# Patient Record
Sex: Female | Born: 1955 | Race: White | Hispanic: No | Marital: Married | Smoking: Never smoker
Health system: Southern US, Community
[De-identification: ages and names within clinical notes are randomized; demographics above are authoritative.]

## PROBLEM LIST (undated history)

## (undated) DIAGNOSIS — K219 Gastro-esophageal reflux disease without esophagitis: Secondary | ICD-10-CM

## (undated) DIAGNOSIS — R51 Headache: Secondary | ICD-10-CM

## (undated) DIAGNOSIS — E079 Disorder of thyroid, unspecified: Secondary | ICD-10-CM

## (undated) DIAGNOSIS — F419 Anxiety disorder, unspecified: Secondary | ICD-10-CM

## (undated) DIAGNOSIS — T7840XA Allergy, unspecified, initial encounter: Secondary | ICD-10-CM

## (undated) DIAGNOSIS — R519 Headache, unspecified: Secondary | ICD-10-CM

## (undated) DIAGNOSIS — E785 Hyperlipidemia, unspecified: Secondary | ICD-10-CM

## (undated) DIAGNOSIS — H269 Unspecified cataract: Secondary | ICD-10-CM

## (undated) DIAGNOSIS — I10 Essential (primary) hypertension: Secondary | ICD-10-CM

## (undated) DIAGNOSIS — F32A Depression, unspecified: Secondary | ICD-10-CM

## (undated) DIAGNOSIS — M1990 Unspecified osteoarthritis, unspecified site: Secondary | ICD-10-CM

## (undated) DIAGNOSIS — E669 Obesity, unspecified: Secondary | ICD-10-CM

## (undated) DIAGNOSIS — M797 Fibromyalgia: Secondary | ICD-10-CM

## (undated) DIAGNOSIS — E119 Type 2 diabetes mellitus without complications: Secondary | ICD-10-CM

## (undated) HISTORY — DX: Unspecified cataract: H26.9

## (undated) HISTORY — PX: EYE SURGERY: SHX253

## (undated) HISTORY — PX: OTHER SURGICAL HISTORY: SHX169

## (undated) HISTORY — DX: Headache: R51

## (undated) HISTORY — DX: Depression, unspecified: F32.A

## (undated) HISTORY — PX: CATARACT EXTRACTION: SUR2

## (undated) HISTORY — DX: Anxiety disorder, unspecified: F41.9

## (undated) HISTORY — DX: Headache, unspecified: R51.9

## (undated) HISTORY — DX: Hyperlipidemia, unspecified: E78.5

## (undated) HISTORY — DX: Fibromyalgia: M79.7

## (undated) HISTORY — DX: Essential (primary) hypertension: I10

## (undated) HISTORY — DX: Gastro-esophageal reflux disease without esophagitis: K21.9

## (undated) HISTORY — DX: Obesity, unspecified: E66.9

## (undated) HISTORY — DX: Allergy, unspecified, initial encounter: T78.40XA

## (undated) HISTORY — DX: Disorder of thyroid, unspecified: E07.9

## (undated) HISTORY — DX: Type 2 diabetes mellitus without complications: E11.9

## (undated) HISTORY — DX: Unspecified osteoarthritis, unspecified site: M19.90

---

## 1998-05-21 ENCOUNTER — Other Ambulatory Visit: Admission: RE | Admit: 1998-05-21 | Discharge: 1998-05-21 | Payer: Self-pay | Admitting: Obstetrics & Gynecology

## 1999-06-06 ENCOUNTER — Other Ambulatory Visit: Admission: RE | Admit: 1999-06-06 | Discharge: 1999-06-06 | Payer: Self-pay | Admitting: Obstetrics & Gynecology

## 2000-07-27 ENCOUNTER — Other Ambulatory Visit: Admission: RE | Admit: 2000-07-27 | Discharge: 2000-07-27 | Payer: Self-pay | Admitting: Obstetrics & Gynecology

## 2000-08-19 ENCOUNTER — Encounter: Admission: RE | Admit: 2000-08-19 | Discharge: 2000-11-17 | Payer: Self-pay

## 2001-08-09 ENCOUNTER — Other Ambulatory Visit: Admission: RE | Admit: 2001-08-09 | Discharge: 2001-08-09 | Payer: Self-pay | Admitting: Obstetrics & Gynecology

## 2002-09-21 ENCOUNTER — Other Ambulatory Visit: Admission: RE | Admit: 2002-09-21 | Discharge: 2002-09-21 | Payer: Self-pay | Admitting: Obstetrics & Gynecology

## 2003-10-16 ENCOUNTER — Other Ambulatory Visit: Admission: RE | Admit: 2003-10-16 | Discharge: 2003-10-16 | Payer: Self-pay | Admitting: Obstetrics & Gynecology

## 2004-12-02 ENCOUNTER — Other Ambulatory Visit: Admission: RE | Admit: 2004-12-02 | Discharge: 2004-12-02 | Payer: Self-pay | Admitting: Obstetrics & Gynecology

## 2008-10-11 ENCOUNTER — Emergency Department (HOSPITAL_COMMUNITY): Admission: EM | Admit: 2008-10-11 | Discharge: 2008-10-11 | Payer: Self-pay | Admitting: Emergency Medicine

## 2010-12-17 LAB — POCT CARDIAC MARKERS
CKMB, poc: <1 ng/mL — ABNORMAL LOW (ref 1.0–8.0)
Myoglobin, poc: 35.7 ng/mL (ref 12–200)
Troponin i, poc: <0.05 ng/mL (ref 0.00–0.09)

## 2011-11-10 ENCOUNTER — Other Ambulatory Visit: Payer: Self-pay | Admitting: Neurosurgery

## 2011-11-10 DIAGNOSIS — G35 Multiple sclerosis: Secondary | ICD-10-CM

## 2011-11-13 ENCOUNTER — Ambulatory Visit
Admission: RE | Admit: 2011-11-13 | Discharge: 2011-11-13 | Disposition: A | Discharge status: Home / Self Care | Payer: BC Managed Care – PPO | Source: Ambulatory Visit | Attending: Neurosurgery | Admitting: Neurosurgery

## 2011-11-13 DIAGNOSIS — G35 Multiple sclerosis: Secondary | ICD-10-CM

## 2012-03-18 ENCOUNTER — Other Ambulatory Visit: Payer: Self-pay | Admitting: Neurosurgery

## 2012-03-18 DIAGNOSIS — M542 Cervicalgia: Secondary | ICD-10-CM

## 2012-03-18 DIAGNOSIS — M5410 Radiculopathy, site unspecified: Secondary | ICD-10-CM

## 2012-03-25 ENCOUNTER — Other Ambulatory Visit: Payer: Self-pay | Admitting: Neurology

## 2012-03-25 DIAGNOSIS — R9089 Other abnormal findings on diagnostic imaging of central nervous system: Secondary | ICD-10-CM

## 2012-03-25 DIAGNOSIS — G35 Multiple sclerosis: Secondary | ICD-10-CM

## 2012-03-25 DIAGNOSIS — R200 Anesthesia of skin: Secondary | ICD-10-CM

## 2012-03-26 ENCOUNTER — Other Ambulatory Visit: Payer: BC Managed Care – PPO

## 2012-03-29 ENCOUNTER — Other Ambulatory Visit: Payer: Self-pay | Admitting: Neurology

## 2012-03-29 ENCOUNTER — Ambulatory Visit
Admission: RE | Admit: 2012-03-29 | Discharge: 2012-03-29 | Disposition: A | Discharge status: Home / Self Care | Payer: BC Managed Care – PPO | Source: Ambulatory Visit | Attending: Neurosurgery | Admitting: Neurosurgery

## 2012-03-29 ENCOUNTER — Ambulatory Visit
Admission: RE | Admit: 2012-03-29 | Discharge: 2012-03-29 | Disposition: A | Discharge status: Home / Self Care | Payer: BC Managed Care – PPO | Source: Ambulatory Visit | Attending: Neurology | Admitting: Neurology

## 2012-03-29 VITALS — BP 137/96 | HR 74

## 2012-03-29 VITALS — BP 141/76 | HR 80

## 2012-03-29 DIAGNOSIS — M542 Cervicalgia: Secondary | ICD-10-CM

## 2012-03-29 DIAGNOSIS — M5410 Radiculopathy, site unspecified: Secondary | ICD-10-CM

## 2012-03-29 DIAGNOSIS — G35 Multiple sclerosis: Secondary | ICD-10-CM

## 2012-03-29 DIAGNOSIS — R200 Anesthesia of skin: Secondary | ICD-10-CM

## 2012-03-29 DIAGNOSIS — R839 Unspecified abnormal finding in cerebrospinal fluid: Secondary | ICD-10-CM

## 2012-03-29 DIAGNOSIS — R9089 Other abnormal findings on diagnostic imaging of central nervous system: Secondary | ICD-10-CM

## 2012-03-29 DIAGNOSIS — G35D Multiple sclerosis, unspecified: Secondary | ICD-10-CM

## 2012-03-29 LAB — CSF CELL COUNT WITH DIFFERENTIAL

## 2012-03-29 MED ORDER — MEPERIDINE HCL 100 MG/ML IJ SOLN
75.0000 mg | Freq: Once | INTRAMUSCULAR | Status: AC
  Administered 2012-03-29: 75 mg via INTRAMUSCULAR

## 2012-03-29 MED ORDER — DIAZEPAM 5 MG PO TABS
10.0000 mg | ORAL_TABLET | Freq: Once | ORAL | Status: AC
  Administered 2012-03-29: 10 mg via ORAL

## 2012-03-29 MED ORDER — IOHEXOL 300 MG/ML  SOLN: 10.0000 mL | Freq: Once | INTRAMUSCULAR | Status: AC | PRN

## 2012-03-29 MED ORDER — ONDANSETRON HCL 4 MG/2ML IJ SOLN: 4.0000 mg | Freq: Four times a day (QID) | INTRAMUSCULAR | Status: DC | PRN

## 2012-03-29 MED ORDER — ONDANSETRON HCL 4 MG/2ML IJ SOLN
4.0000 mg | Freq: Once | INTRAMUSCULAR | Status: AC
  Administered 2012-03-29: 4 mg via INTRAMUSCULAR

## 2012-03-29 NOTE — Progress Notes (Signed)
Patient states she has been off Citalopram and Ultram for the past two days.  Discharge instructions explained to patient.  jkl

## 2012-03-30 LAB — GLUCOSE, CSF: Glucose, CSF: 54 mg/dL (ref 43–76)

## 2012-03-30 LAB — PROTEIN, CSF: Total Protein, CSF: 38 mg/dL (ref 15–45)

## 2012-03-31 LAB — VDRL, CSF

## 2012-04-01 LAB — CNS IGG SYNTHESIS RATE, CSF+BLOOD
Albumin, Serum(Neph): 4.4 g/dL (ref 3.5–4.9)
IgG Index, CSF: 0.45 (ref <0.66)

## 2012-04-01 LAB — ANGIOTENSIN CONVERTING ENZYME, CSF: ACE, CSF: 6 U/L (ref <=15)

## 2012-04-02 LAB — B. BURGDORFI ANTIBODIES, CSF

## 2012-11-08 ENCOUNTER — Other Ambulatory Visit: Payer: Self-pay | Admitting: Neurology

## 2012-11-08 DIAGNOSIS — R413 Other amnesia: Secondary | ICD-10-CM

## 2012-11-08 DIAGNOSIS — M797 Fibromyalgia: Secondary | ICD-10-CM

## 2012-11-08 DIAGNOSIS — H532 Diplopia: Secondary | ICD-10-CM

## 2012-11-11 ENCOUNTER — Ambulatory Visit
Admission: RE | Admit: 2012-11-11 | Discharge: 2012-11-11 | Disposition: A | Discharge status: Home / Self Care | Payer: BC Managed Care – PPO | Source: Ambulatory Visit | Attending: Neurology | Admitting: Neurology

## 2012-11-11 DIAGNOSIS — M797 Fibromyalgia: Secondary | ICD-10-CM

## 2012-11-11 DIAGNOSIS — R413 Other amnesia: Secondary | ICD-10-CM

## 2012-11-11 DIAGNOSIS — H532 Diplopia: Secondary | ICD-10-CM

## 2012-11-11 MED ORDER — GADOBENATE DIMEGLUMINE 529 MG/ML IV SOLN
16.0000 mL | Freq: Once | INTRAVENOUS | Status: AC | PRN
  Administered 2012-11-11: 16 mL via INTRAVENOUS

## 2013-03-23 ENCOUNTER — Ambulatory Visit: Payer: BC Managed Care – PPO | Admitting: Dietician

## 2013-03-24 ENCOUNTER — Encounter: Payer: BC Managed Care – PPO | Attending: Physician Assistant | Admitting: Dietician

## 2013-03-24 ENCOUNTER — Encounter: Payer: Self-pay | Admitting: Dietician

## 2013-03-24 VITALS — Ht 64.0 in | Wt 227.2 lb

## 2013-03-24 DIAGNOSIS — E669 Obesity, unspecified: Secondary | ICD-10-CM | POA: Insufficient documentation

## 2013-03-24 DIAGNOSIS — Z713 Dietary counseling and surveillance: Secondary | ICD-10-CM | POA: Insufficient documentation

## 2013-03-24 NOTE — Patient Instructions (Addendum)
Make sure that you eat three meals and 2 snacks most days. Eat meals at the table and not in front of the TV.  Eat in proportion to MyPlate - buy ready to eat salads and vegetables.  Have some snacks available at work. Consider planning out meals for the week using meal guidelines from [employer]it.  Schedule physical activity seek out classes or people to join you.

## 2013-03-24 NOTE — Progress Notes (Signed)
   Medical Nutrition Therapy:  Appt start time: 1130 end time:  1230.   Assessment:  Primary concerns today: Interested in losing weight and "getting more energy". 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 Denise Liu states she was 95 pounds when got married at 57 years old and has gained weight over time with a sit-down job.  Denise Liu reports that she currently lacking motivation to eat healthier meals and get more physical activity. Has previously lost 12 pounds on Weight Watchers but lost motivation to continue after reaching a weight loss plateau and has since gained that weight back and more. Also previously went to a gym and had a  Systems analyst but struggles with consistency. Would love to get down to 140 lbs.  Currently eats out about two times per week, regularly eats cakes and other sweets and is trying to cut out bread. Denise Liu says that she enjoys walking when she is motivated to do it. Discussed recruiting co-workers, friends, or husband to go on regular walks with her, planning meals and snacks for the week, and eating more mindfully at the table instead of in front of the TV. Discussed making splurges such as cake really worth it by considering the rest of the meal with it.    MEDICATIONS: See List   DIETARY INTAKE:   24-hr recall:  B ( AM): oatmeal with fruit OR scrambled egg with little bit of cheese and toast with water or sometimes skim milk  Snk ( AM): none  L ( PM): sandwich such as [place] and cheese, torillas OR  leftovers such as chicken or casserole OR salad OR Pizza with water Snk ( PM): candy bar or cookie D ( PM): spaghetti OR chicken and broccoli usually vegetable and meat Snk ( PM): cake or cookies Beverages: usually water  Usual physical activity: Goes for walks around once per week for 45 minutes.    Estimated energy needs: 1600 calories 180 g carbohydrates 120  g protein 44 g fat  Progress Towards Goal(s):  In progress.   Nutritional Diagnosis:  NB-1.1 Food and nutrition-related knowledge deficit As related to meal skipping, energy dense snack choices, and lack of physical activity.  As evidenced by BMI of 39.1.    Intervention:  Nutrition counseling included:  Make sure that you eat three meals and 2 snacks most days. Eat meals at the table and not in front of the TV.  Eat in proportion to MyPlate - buy ready to eat salads and vegetables.  Have some snacks available at work. Consider planning out meals for the week using meal guidelines from [employer].  Schedule physical activity seek out classes or people to join you.   Handouts given during visit include:  MyPlate  1200 and 1500 calorie meal plans from eatright.org  Healthy Snack handout  Monitoring/Evaluation:  Dietary intake, exercise, mindful eating, and body weight in 4 week(s).

## 2013-04-21 ENCOUNTER — Encounter: Payer: BC Managed Care – PPO | Attending: Physician Assistant | Admitting: Dietician

## 2013-04-21 DIAGNOSIS — E669 Obesity, unspecified: Secondary | ICD-10-CM | POA: Insufficient documentation

## 2013-04-21 DIAGNOSIS — Z713 Dietary counseling and surveillance: Secondary | ICD-10-CM | POA: Insufficient documentation

## 2013-04-21 NOTE — Progress Notes (Signed)
   Medical Nutrition Therapy:  Appt start time: 1200 end time:  1230.  Assessment: Lost pound since last visit. Reports still being very busy with school and eating less bread, more vegetables, and smaller portion sizes. In past week has been eating more sweets (apple fritters) since husband has been out of town. Trying to eat less meals in front the TV.   Walking about one time per week. Taking the stairs instead of the elevator but is struggling finding the time and energy to exercise.   Wt Readings from Last 3 Encounters:  04/21/13 226 lb 11.2 oz (102.83 kg)  03/24/13 227 lb 3.2 oz (103.057 kg)   Ht Readings from Last 3 Encounters:  04/21/13 5\' 4"  (1.626 m)  03/24/13 5\' 4"  (1.626 m)   Body mass index is 38.89 kg/(m^2). @BMIFA @ Normalized weight-for-age data available only for age 92 to 20 years. Normalized stature-for-age data available only for age 92 to 20 years.   MEDICATIONS: See List   DIETARY INTAKE:   24-hr recall:  B ( AM): oatmeal with fruit OR scrambled egg with little bit of cheese and toast with water or sometimes skim milk  Snk ( AM): none  L ( PM): sandwich such as [place] and cheese, torillas OR  leftovers such as chicken or casserole OR salad OR Pizza with water Snk ( PM): candy bar or cookie D ( PM): spaghetti OR chicken and broccoli usually vegetable and meat Snk ( PM): cake or cookies Beverages: usually water  Usual physical activity: Goes for walks around once per week for 45 minutes.    Estimated energy needs: 1600 calories 180 g carbohydrates 120 g protein 44 g fat  Progress Towards Goal(s):  In progress.   Nutritional Diagnosis:  NB-1.1 Food and nutrition-related knowledge deficit As related to meal skipping, energy dense snack choices, and lack of physical activity.  As evidenced by BMI of 39.1.    Intervention:  Nutrition counseling provided. Discussed setting up home environment so it is easier to make healthy choices by not buying sweets  anymore. Talked about having snacks with protein and serving smaller portion sizes at meal times. Encouraged her to keep adding vegetables to meals even to take out meals she brings home. Encouraged her to find time to exercise and eat meals without TV.   Plan: Make sure that you eat three meals and 2-3 snacks most days. Eat meals at the table and not in front of the TV.  Continue filling half of your plate with vegetables. Continue drinking water throughout the day or no-calorie drinks.  Consider not buying sweets at home.  Have some snacks available at work such peanut butter crackers, cheese and crackers, fruit and nuts.  Try to add more physical activity into your week with a goal of 30 min/day 5 x week.   Handouts given during visit include:  15g CHO snacks  Monitoring/Evaluation:  Dietary intake, exercise, mindful eating, and body weight in 4 week(s).

## 2013-04-21 NOTE — Patient Instructions (Addendum)
Make sure that you eat three meals and 2-3 snacks most days. Eat meals at the table and not in front of the TV.  Continue filling half of your plate with vegetables. Continue drinking water throughout the day or no-calorie drinks.  Consider not buying sweets at home.  Have some snacks available at work such peanut butter crackers, cheese and crackers, fruit and nuts.  Try to add more physical activity into your week with a goal of 30 min/day 5 x week.

## 2013-05-19 ENCOUNTER — Encounter: Payer: BC Managed Care – PPO | Attending: Physician Assistant | Admitting: Dietician

## 2013-05-19 VITALS — Ht 64.0 in | Wt 224.5 lb

## 2013-05-19 DIAGNOSIS — Z713 Dietary counseling and surveillance: Secondary | ICD-10-CM | POA: Insufficient documentation

## 2013-05-19 DIAGNOSIS — E669 Obesity, unspecified: Secondary | ICD-10-CM | POA: Insufficient documentation

## 2013-05-19 NOTE — Patient Instructions (Addendum)
Continue making sure that you eat three meals and 2-3 snacks most days. Keep working on eat meals at the table and not in front of the TV.  Continue filling half of your plate with vegetables. Continue drinking water throughout the day or no-calorie drinks.  Consider having only one cookie a day.  Continue having some snacks available at work such peanut butter crackers, cheese and crackers, fruit and nuts.  Try to add more physical activity into your week with a goal of 30 min/day 5 x week. Consider walking at lunchtime with co-workers.

## 2013-05-19 NOTE — Progress Notes (Signed)
   Medical Nutrition Therapy:  Appt start time: 1200 end time:  1230.  Assessment: Denise Liu is here for a follow up for weight loss. Lost 2 pounds since the last visit. Work is really busy (even more than before). Has not had any time to exercise. Eating less sweets than before and less meals in front of the TV.   Wt Readings from Last 3 Encounters:  05/19/13 224 lb 8 oz (101.833 kg)  04/21/13 226 lb 11.2 oz (102.83 kg)  03/24/13 227 lb 3.2 oz (103.057 kg)   Ht Readings from Last 3 Encounters:  05/19/13 5\' 4"  (1.626 m)  04/21/13 5\' 4"  (1.626 m)  03/24/13 5\' 4"  (1.626 m)   Body mass index is 38.52 kg/(m^2). @BMIFA @ Normalized weight-for-age data available only for age 50 to 20 years. Normalized stature-for-age data available only for age 50 to 20 years.   MEDICATIONS: See List   DIETARY INTAKE:   24-hr recall:  B ( AM): oatmeal with fruit OR scrambled egg with little bit of cheese and toast with water or sometimes skim milk  Snk ( AM): mixed almonds with dried fruit  L ( PM): sandwich such as [place] and cheese, tortillas OR  leftovers such as chicken or casserole OR salad OR Pizza with water Snk ( PM):none D ( PM): spaghetti OR chicken and broccoli usually vegetable and meat Snk ( PM): cake or cookies Beverages: usually water  Usual physical activity: Has not had time to exercise.    Estimated energy needs: 1600 calories 180 g carbohydrates 120 g protein 44 g fat  Progress Towards Goal(s):  In progress.   Nutritional Diagnosis:  NB-1.1 Food and nutrition-related knowledge deficit As related to meal skipping, energy dense snack choices, and lack of physical activity.  As evidenced by BMI of 39.1.    Intervention:  Nutrition counseling provided. Recommended focusing on incorporating exercise into routine, continuing to eat small portions with vegetables, and limiting sweets.   Plan: Make sure that you eat three meals and 2-3 snacks most days. Eat meals at the table and  not in front of the TV.  Continue filling half of your plate with vegetables. Continue drinking water throughout the day or no-calorie drinks.  Consider not buying sweets at home.  Have some snacks available at work such peanut butter crackers, cheese and crackers, fruit and nuts.  Try to add more physical activity into your week with a goal of 30 min/day 5 x week.    Monitoring/Evaluation:  Dietary intake, exercise, mindful eating, and body weight in 2 month(s).

## 2013-07-07 ENCOUNTER — Other Ambulatory Visit: Payer: Self-pay

## 2014-06-16 ENCOUNTER — Other Ambulatory Visit: Payer: Self-pay

## 2014-06-29 ENCOUNTER — Other Ambulatory Visit: Payer: Self-pay | Admitting: Otolaryngology

## 2014-06-29 DIAGNOSIS — R070 Pain in throat: Secondary | ICD-10-CM

## 2014-07-10 ENCOUNTER — Ambulatory Visit
Admission: RE | Admit: 2014-07-10 | Discharge: 2014-07-10 | Disposition: A | Discharge status: Home / Self Care | Payer: BC Managed Care – PPO | Source: Ambulatory Visit | Attending: Otolaryngology | Admitting: Otolaryngology

## 2014-07-10 DIAGNOSIS — R070 Pain in throat: Secondary | ICD-10-CM

## 2014-07-10 MED ORDER — IOHEXOL 300 MG/ML  SOLN
75.0000 mL | Freq: Once | INTRAMUSCULAR | Status: AC | PRN
  Administered 2014-07-10: 75 mL via INTRAVENOUS

## 2016-05-12 IMAGING — CT CT NECK W/ CM
4 of 6 series · 14 of 33 positions shown, 16 images · IV contrast (75CC OMNI 300)
Comparison: None.

CLINICAL DATA: Left throat and ear discomfort 6 weeks.

EXAM:
CT NECK WITH CONTRAST
TECHNIQUE: Multidetector CT imaging of the neck was performed using the
standard protocol following the bolus administration of intravenous
contrast.
CONTRAST:  75mL OMNIPAQUE IOHEXOL 300 MG/ML  SOLN

[Series 3: axial neck · axial · 0.42mm/px · z∈[+79,+152]mm · 2 of 87 slices shown]
[im 29/87  bone]
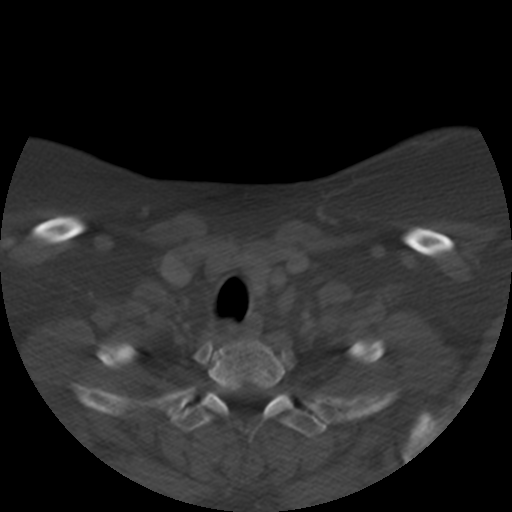
[im 58/87  bone]
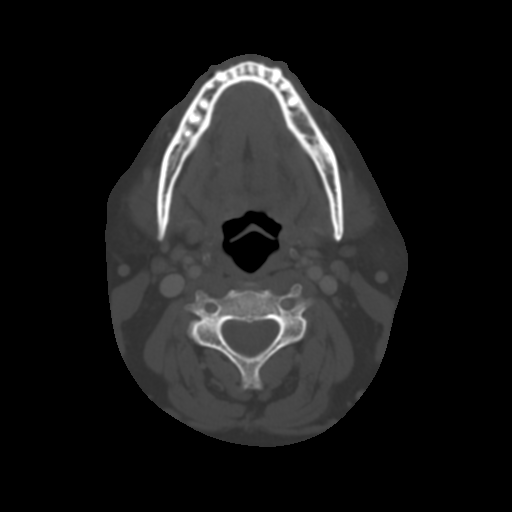

[Series 200: coronal · coronal · 0.42mm/px · 3 of 105 slices shown]
[im 29/105  bone]
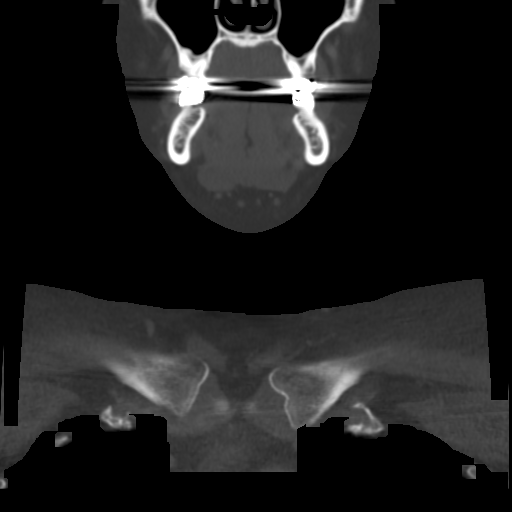
[im 45/105  bone]
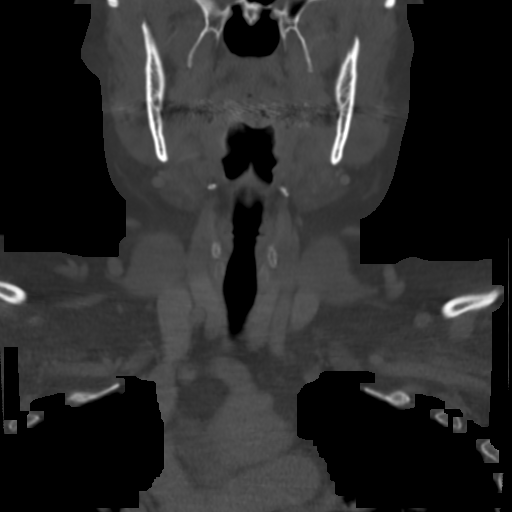
[im 61/105  bone]
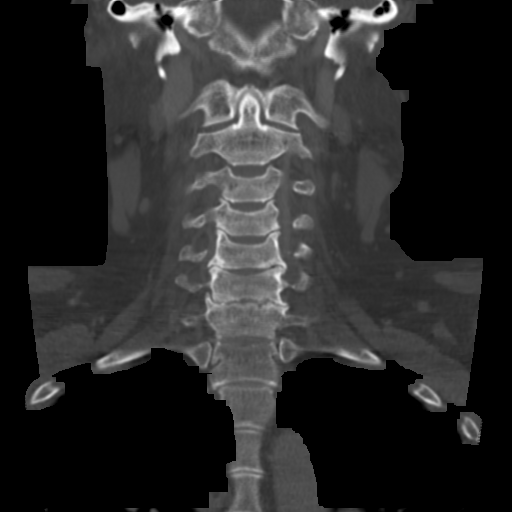

[Series 201: sagittal · sagittal · 0.42mm/px · 5 of 105 slices shown, 6 images]
[im 35/105  bone]
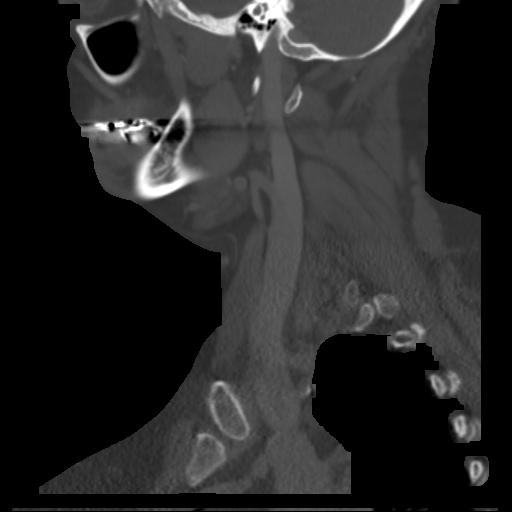
[im 44/105  bone]
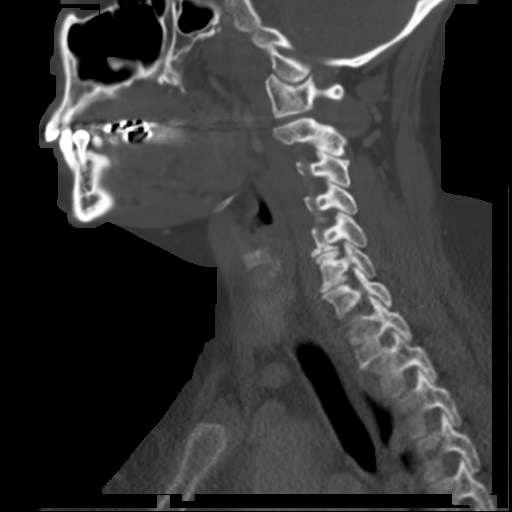
[im 53/105  soft-tissue]
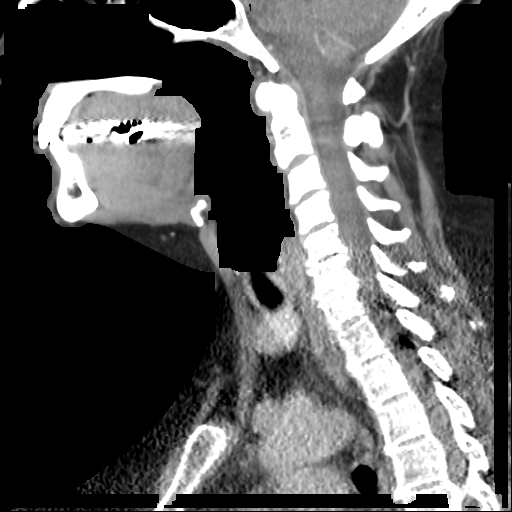
[im 53/105  bone]
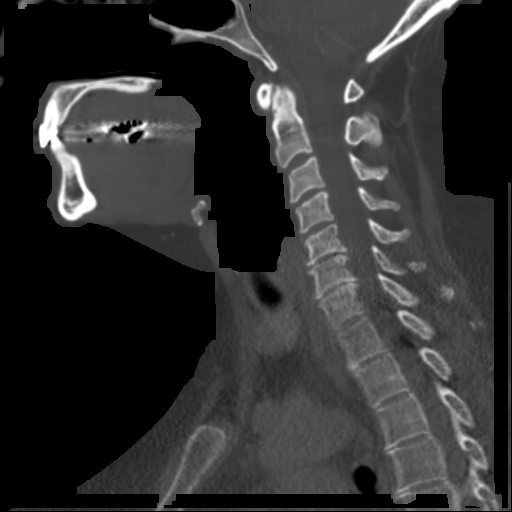
[im 61/105  bone]
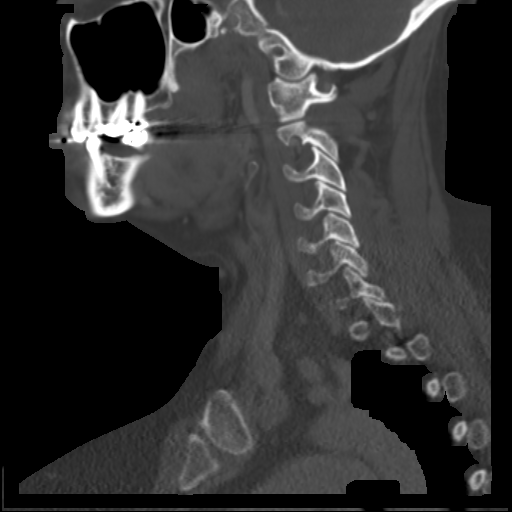
[im 70/105  bone]
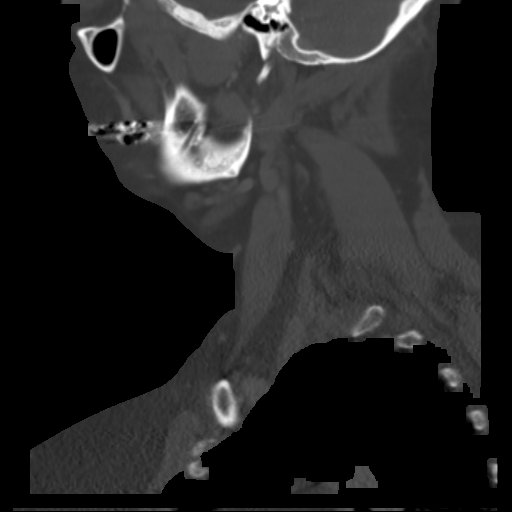

[Series 202: angled to hyoid · axial · 0.42mm/px · z∈[+4,+149]mm · 4 of 132 slices shown, 5 images]
[im 27/132  soft-tissue]
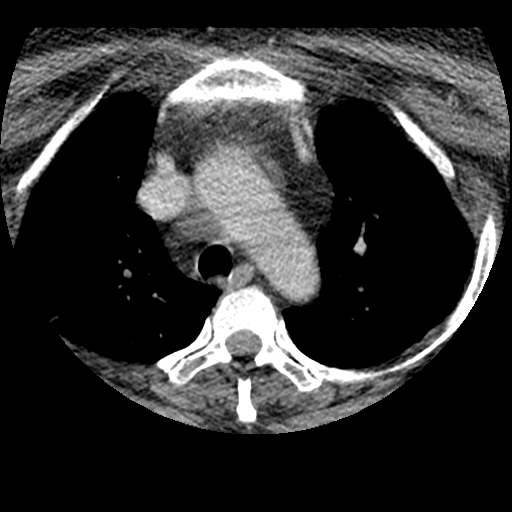
[im 27/132  bone]
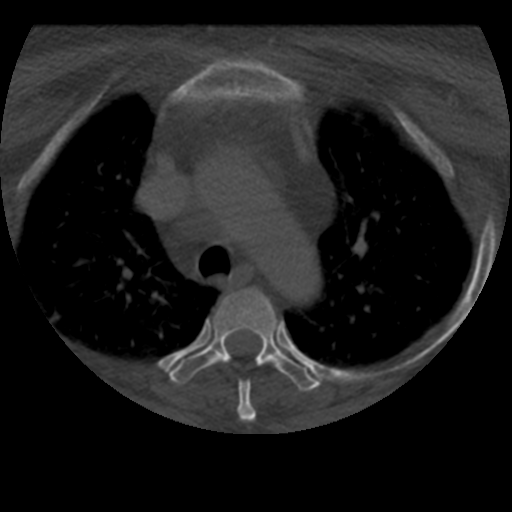
[im 53/132  bone]
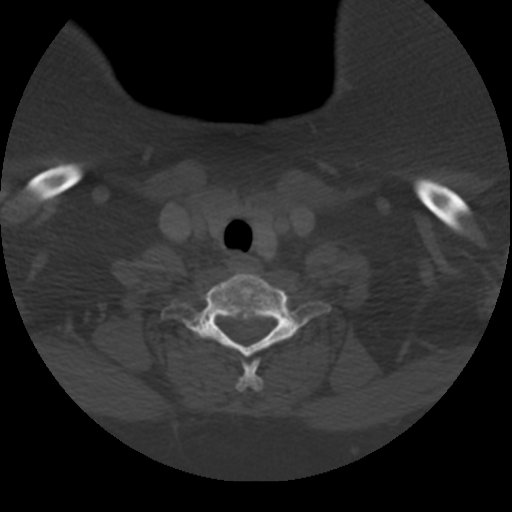
[im 79/132  bone]
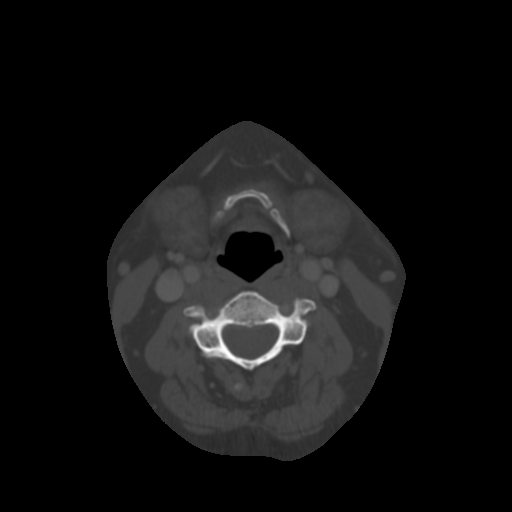
[im 105/132  bone]
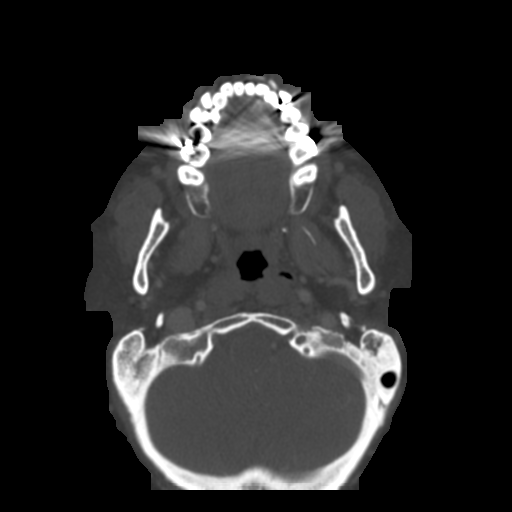

[14 of 33 positions shown; findings below may reference images not displayed]

FINDINGS: Lung apices are clear. Cervical spondylosis with disc degeneration
and diffuse uncinate spurring throughout the cervical spine, most
prominent at C5-6 and C6-7. No focal bony lesion identified.

Visualized paranasal sinuses and mastoid sinuses are clear
bilaterally.

The tongue and tonsils are normal. The pharynx appears normal
without edema or mass. Epiglottis and larynx are normal.

Parotid and submandibular glands are normal.  Thyroid is normal.

No pathologic adenopathy. Left level 2 lymph node 6.7 mm. Right
level 2 lymph node 7.6 mm.

Negative for mass lesion.
IMPRESSION: Negative for mass or adenopathy. No cause for throat pain is
identified. Direct visualization of the mucosa may be helpful.

## 2017-10-13 DIAGNOSIS — F4321 Adjustment disorder with depressed mood: Secondary | ICD-10-CM | POA: Insufficient documentation

## 2018-10-11 ENCOUNTER — Encounter: Payer: Self-pay | Admitting: Mental Health

## 2018-10-11 ENCOUNTER — Ambulatory Visit (INDEPENDENT_AMBULATORY_CARE_PROVIDER_SITE_OTHER): Admitting: Mental Health

## 2018-10-11 DIAGNOSIS — F331 Major depressive disorder, recurrent, moderate: Secondary | ICD-10-CM | POA: Diagnosis not present

## 2018-10-11 DIAGNOSIS — F411 Generalized anxiety disorder: Secondary | ICD-10-CM | POA: Diagnosis not present

## 2018-10-11 NOTE — Progress Notes (Signed)
[facility] Counsel[facility] Initial Adult Exam- Part I  Name: Denise Liu Date: 10/11/2018 MRN: [id number] DOB: November 10, 1955 PCP: Windy Carina, PA-C  Time spent: 45 minutes   Guardian/Payee:patent   Paperwork requested:  No   Reason for Visit /Presenting Problem: No chief complaint on file.   Mental Status Exam:   Appearance:   obese     Behavior:  Sharing  Motor:  FMG  Speech/Language:   Normal Rate  Affect:  Depressed  Mood:  depressed  Thought process:  Poor memory. Poor concentration  Thought content:    Rumination  Sensory/Perceptual disturbances:    WNL  Orientation:  oriented to person, place and time/date  Attention:  Fair  Concentration:  Poor  Memory:  fiber fog  Fund of knowledge:   Good  Insight:    Good  Judgment:   Good  Impulse Control:  Good   Reported Symptoms:  Sleep disturbance, Fatigue, Physical aches and pain, Lack of motivation and depressed  Risk Assessment: Danger to Self:  No Self-injurious Behavior: No Danger to Others: No Duty to Warn:no Physical Aggression / Violence:No  Access to Firearms a concern: No  Gang Involvement:No  Patient / guardian was educated about steps to take if suicide or homicide risk level increases between visits: no While future psychiatric events cannot be accurately predicted, the patient does not currently require acute inpatient psychiatric care and does not currently meet [facility] involuntary commitment criteria.  Substance Abuse History: Current substance abuse: No     Past Psychiatric History:   Previous psychological history is significant for depression Outpatient Providers:Devashwar History of Psych Hospitalization: No  Psychological Testing: none    Medical History/Surgical History:reviewed Past Medical History:  Diagnosis Date  . Hyperlipidemia   . Hypertension   . Obesity     Past Surgical History:  Procedure Laterality Date  . EYE SURGERY      Medications: Current Outpatient  Medications  Medication Sig Dispense Refill  . atorvastatin (LIPITOR) 10 MG tablet Take 10 mg by mouth daily.    Marland Kitchen b complex vitamins capsule Take 1 capsule by mouth daily.    . calcium carbonate 200 MG capsule Take 600 mg by mouth daily.    . Cholecalciferol (VITAMIN D3) 2000 UNITS TABS Take 4,000 Int'l Units by mouth.    . co-enzyme Q-10 50 MG capsule Take 300 mg by mouth daily.    . cyclobenzaprine (FLEXERIL) 10 MG tablet Take 10 mg by mouth 3 (three) times daily as needed for muscle spasms.    . Glucosamine-Chondroit-Vit C-Mn (GLUCOSAMINE 1500 COMPLEX PO) Take by mouth.    . lidocaine (LIDODERM) 5 % Place 1 patch onto the skin daily. Remove & Discard patch within 12 hours or as directed by MD    . losartan (COZAAR) 25 MG tablet Take 25 mg by mouth daily.    . Magnesium 250 MG TABS Take by mouth.    Marland Kitchen MANGANESE PO Take by mouth.    . Misc Natural Products (TART CHERRY ADVANCED PO) Take by mouth.    . modafinil (PROVIGIL) 100 MG tablet Take 200 mg by mouth daily.    . Multiple Vitamins-Minerals (MULTIVITAMIN & MINERAL PO) Take by mouth.    . naproxen sodium (ANAPROX) 220 MG tablet Take 220 mg by mouth as needed.    . Omega-3 Fatty Acids (FISH OIL) 1200 MG CAPS Take 2,400 mg by mouth.    . sertraline (ZOLOFT) 100 MG tablet Take 100 mg by mouth daily.    Marland Kitchen  traMADol (ULTRAM) 50 MG tablet Take 50 mg by mouth every 6 (six) hours as needed for pain.     No current facility-administered medications for this visit.     Allergies  Allergen Reactions  . Codeine   . Topamax [Topiramate] Other (See Comments)    Significant somnolence    Diagnoses:    ICD-10-CM   1. Generalized anxiety disorder F41.1   2. Major depressive disorder, recurrent episode, moderate (HCC) F33.1      Part II to be continued at next session:   No.   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

## 2018-10-25 ENCOUNTER — Ambulatory Visit (INDEPENDENT_AMBULATORY_CARE_PROVIDER_SITE_OTHER): Admitting: Mental Health

## 2018-10-25 ENCOUNTER — Encounter: Payer: Self-pay | Admitting: Mental Health

## 2018-10-25 DIAGNOSIS — F331 Major depressive disorder, recurrent, moderate: Secondary | ICD-10-CM | POA: Diagnosis not present

## 2018-10-25 DIAGNOSIS — F411 Generalized anxiety disorder: Secondary | ICD-10-CM

## 2018-10-25 NOTE — Progress Notes (Signed)
      [facility] Counselor/Therapist Progress Note  Patient ID: Denise Liu, MRN: [id number],    Date: 10/25/2018  Time Spent: 45 minutes  Treatment Type: Individual Therapy  Reported Symptoms:   Mental Status Exam:  Appearance:   Casual     Behavior:  Appropriate and Sharing  Motor:  fibromyalgia  Speech/Language:   Normal Rate  Affect:  Depressed  Mood:  anxious and depressed  Thought process:  unmotivated  Thought content:    negative  Sensory/Perceptual disturbances:    WNL  Orientation:  oriented to person, place and time/date  Attention:  Fair  Concentration:  Fair  Memory:  fair  Fund of knowledge:   Good  Insight:    Good  Judgment:   Good  Impulse Control:  Good   Risk Assessment: Danger to Self:  No Self-injurious Behavior: No Danger to Others: No Duty to Warn:no Physical Aggression / Violence:No  Access to Firearms a concern: No  Gang Involvement:No   Subjective: Depressed and anxious. Adhedonia. Trying not to isolate. Able to do some exercise occasionally. Motivation and goal setting poor. Sleep ade quate though reports stays up too late and sleeps too late in the morning. Exhausted.   Interventions: Cognitive Behavioral Therapy, Solution-Oriented/Positive Psychology, Insight-Oriented and Interpersonal  Diagnosis:   ICD-10-CM   1. Generalized anxiety disorder F41.1   2. Major depressive disorder, recurrent episode, moderate (HCC) F33.1     Plan:  FMG and migraine management            Increase motivation            Decrease symptoms of depression and anxiety and adhedonia            Validation and support  Ulice Bold, [facility]

## 2018-11-04 ENCOUNTER — Encounter: Payer: Self-pay | Admitting: Physician Assistant

## 2018-11-04 ENCOUNTER — Ambulatory Visit (INDEPENDENT_AMBULATORY_CARE_PROVIDER_SITE_OTHER): Admitting: Physician Assistant

## 2018-11-04 VITALS — BP 163/94 | HR 101 | Ht 64.0 in | Wt 230.0 lb

## 2018-11-04 DIAGNOSIS — F331 Major depressive disorder, recurrent, moderate: Secondary | ICD-10-CM

## 2018-11-04 MED ORDER — BUPROPION HCL ER (XL) 150 MG PO TB24: 150.0000 mg | ORAL_TABLET | Freq: Every day | ORAL | 1 refills | Status: DC

## 2018-11-04 NOTE — Patient Instructions (Signed)
Multi-Vitamin B Complex Vitamin D Gingko Biloba
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
## 2018-11-08 ENCOUNTER — Ambulatory Visit (INDEPENDENT_AMBULATORY_CARE_PROVIDER_SITE_OTHER): Admitting: Mental Health

## 2018-11-08 ENCOUNTER — Encounter: Payer: Self-pay | Admitting: Mental Health

## 2018-11-08 DIAGNOSIS — Z79899 Other long term (current) drug therapy: Secondary | ICD-10-CM | POA: Diagnosis not present

## 2018-11-08 DIAGNOSIS — F411 Generalized anxiety disorder: Secondary | ICD-10-CM

## 2018-11-08 NOTE — Progress Notes (Signed)
      [facility] Counselor/Therapist Progress Note  Patient ID: Denise Liu, MRN: [id number],    Date: 11/08/2018  Time Spent: 45  minutes  Treatment Type: Individual Therapy  Reported Symptoms:  Chronic pain from FMG, lethargy, not feeling well - sick with cold today. Not motivated.  Mental Status Exam:  Appearance:   Casual and sick with cold     Behavior:  Sharing and lethargic  Motor:  slowed down, unwell  Speech/Language:   Normal Rate  Affect:  Constricted and Depressed  Mood:  depressed  Thought process:  blocked and negative  Thought content:    negative  Sensory/Perceptual disturbances:    WNL  Orientation:  oriented to person, place and time/date  Attention:  Good  Concentration:  Fair  Memory:  fair  Fund of knowledge:   Good  Insight:    Good  Judgment:   Good  Impulse Control:  Good   Risk Assessment: Danger to Self:  No Self-injurious Behavior: No Danger to Others: No Duty to Warn:no Physical Aggression / Violence:No  Access to Firearms a concern: No  Gang Involvement:No   Subjective:  Sick with bad cold. Lethargic, depressed, unmotivated   Interventions: Cognitive Behavioral Therapy, Solution-Oriented/Positive Psychology, Insight-Oriented and Interpersonal  Diagnosis:   ICD-10-CM   1. Encounter for long-term (current) use of medications Z79.899   2. Generalized anxiety disorder F41.1     Plan: FMG issues, migraines management           Increase motivattion, decrease depression           Resolve family conflicts           Decrease symptoms of anxiety           Validation and support  Ulice Bold, [facility]

## 2018-11-22 ENCOUNTER — Ambulatory Visit: Admitting: Mental Health

## 2018-12-13 ENCOUNTER — Encounter: Payer: Self-pay | Admitting: Physician Assistant

## 2018-12-13 ENCOUNTER — Ambulatory Visit (INDEPENDENT_AMBULATORY_CARE_PROVIDER_SITE_OTHER): Admitting: Physician Assistant

## 2018-12-13 ENCOUNTER — Other Ambulatory Visit: Payer: Self-pay

## 2018-12-13 DIAGNOSIS — F411 Generalized anxiety disorder: Secondary | ICD-10-CM | POA: Diagnosis not present

## 2018-12-13 DIAGNOSIS — Z79899 Other long term (current) drug therapy: Secondary | ICD-10-CM | POA: Diagnosis not present

## 2018-12-13 DIAGNOSIS — F331 Major depressive disorder, recurrent, moderate: Secondary | ICD-10-CM

## 2018-12-13 MED ORDER — BUPROPION HCL ER (XL) 150 MG PO TB24: 150.0000 mg | ORAL_TABLET | Freq: Every day | ORAL | 0 refills | Status: DC

## 2018-12-13 NOTE — Progress Notes (Signed)
[facility] Med Check  Patient ID: Denise Liu,  MRN: [id number]  PCP: Windy Liu, Denise J, PA-C  Date of Evaluation: 12/13/2018 Time spent:15 minutes  Chief Complaint:  Chief Complaint    Follow-up     Virtual Visit via Telephone Note  I connected with Denise Liu on 12/13/18 at  2:30 PM EDT by telephone and verified that I am speaking with the correct person using two identifiers.   I discussed the limitations, risks, security and privacy concerns of performing an evaluation and management service by telephone and the availability of in person appointments. I also discussed with the patient that there may be a patient responsible charge related to this service. The patient expressed understanding and agreed to proceed.    HISTORY/CURRENT STATUS: HPI for 6-week follow-up.  Denise Liu was seen on 11/04/2018 for an initial visit.  She had been having more depression and we added Wellbutrin to current regimen.  Patient states that now she is feeling quite a bit better.  She has more energy and motivation.  She wants to do things more than she did before going on the Wellbutrin.  She is not as tired all the time.  She is not crying easily.  She sleeps well feels rested when she gets up.  He wonders if really needs to be on the Zoloft as well as the Wellbutrin.  The Zoloft had been increased from 100 mg to 200 mg approximately early February of this year.  States she is not sure that the increase did much at the time.  The Wellbutrin seems to be what made the most difference.  Denies any anxiety.  Of course with the coronavirus pandemic, she has concerns but otherwise she is doing okay.  Denies muscle or joint pain, stiffness, or dystonia.  Denies dizziness, syncope, seizures, numbness, tingling, tremor, tics, unsteady gait, slurred speech, confusion.   Individual Medical History/ Review of Systems: Changes? :No    Past medications for mental health diagnoses include: Topamax for  migraines   Allergies: Codeine and Topamax [topiramate]  Current Medications:  Current Outpatient Medications:  .  atorvastatin (LIPITOR) 10 MG tablet, Take 10 mg by mouth daily., Disp: , Rfl:  .  b complex vitamins capsule, Take 1 capsule by mouth daily., Disp: , Rfl:  .  buPROPion (WELLBUTRIN XL) 150 MG 24 hr tablet, Take 1 tablet (150 mg total) by mouth daily., Disp: 90 tablet, Rfl: 0 .  Cholecalciferol (VITAMIN D3) 2000 UNITS TABS, Take 4,000 Int'l Units by mouth., Disp: , Rfl:  .  cyclobenzaprine (FLEXERIL) 10 MG tablet, Take 10 mg by mouth 3 (three) times daily as needed for muscle spasms., Disp: , Rfl:  .  fluticasone (FLONASE) 50 MCG/ACT nasal spray, 1 spray by Each Nare route daily., Disp: , Rfl:  .  gabapentin (NEURONTIN) 300 MG capsule, , Disp: , Rfl:  .  losartan (COZAAR) 25 MG tablet, Take 25 mg by mouth daily., Disp: , Rfl:  .  naproxen sodium (ANAPROX) 220 MG tablet, Take 220 mg by mouth as needed., Disp: , Rfl:  .  omeprazole (PRILOSEC) 20 MG capsule, TAKE 1 CAPSULE DAILY, Disp: , Rfl:  .  sertraline (ZOLOFT) 100 MG tablet, Take 200 mg by mouth daily. , Disp: , Rfl:  .  calcium carbonate 200 MG capsule, Take 600 mg by mouth daily., Disp: , Rfl:  .  co-enzyme Q-10 50 MG capsule, Take 300 mg by mouth daily., Disp: , Rfl:  .  Glucosamine-Chondroit-Vit C-Mn (GLUCOSAMINE 1500 COMPLEX PO),  Take by mouth., Disp: , Rfl:  .  lidocaine (LIDODERM) 5 %, Place 1 patch onto the skin daily. Remove & Discard patch within 12 hours or as directed by MD, Disp: , Rfl:  .  Magnesium 250 MG TABS, Take by mouth., Disp: , Rfl:  .  MANGANESE PO, Take by mouth., Disp: , Rfl:  .  Misc Natural Products (TART CHERRY ADVANCED PO), Take by mouth., Disp: , Rfl:  .  modafinil (PROVIGIL) 100 MG tablet, Take 200 mg by mouth daily., Disp: , Rfl:  .  Multiple Vitamins-Minerals (MULTIVITAMIN & MINERAL PO), Take by mouth., Disp: , Rfl:  .  Omega-3 Fatty Acids (FISH OIL) 1200 MG CAPS, Take 2,400 mg by mouth.,  Disp: , Rfl:  .  traMADol (ULTRAM) 50 MG tablet, Take 50 mg by mouth every 6 (six) hours as needed for pain., Disp: , Rfl:  Medication Side Effects: none  Family Medical/ Social History: Changes? No  MENTAL HEALTH EXAM:  There were no vitals taken for this visit.There is no height or weight on file to calculate BMI.  General Appearance: phone visit, unable to assess  Eye Contact:  unable to assess  Speech:  Clear and Coherent  Volume:  Normal  Mood:  Euthymic  Affect:  Appropriate  Thought Process:  Goal Directed  Orientation:  Full (Time, Place, and Person)  Thought Content: Logical   Suicidal Thoughts:  No  Homicidal Thoughts:  No  Memory:  WNL  Judgement:  Good  Insight:  Good  Psychomotor Activity:  unable to assess  Concentration:  Concentration: Good  Recall:  Good  Fund of Knowledge: Good  Language: Good  Assets:  Desire for Improvement  ADL's:  Intact  Cognition: WNL  Prognosis:  Good    DIAGNOSES:    ICD-10-CM   1. Major depressive disorder, recurrent episode, moderate (HCC) F33.1   2. Generalized anxiety disorder F41.1   3. Encounter for long-term (current) use of medications Z79.899     Receiving Psychotherapy: Yes  with Denise Liu, LPC   RECOMMENDATIONS: Glad to see her doing so well! Discussed options as far as the Zoloft.  I think it would be okay to decrease Zoloft to 150mg /d, and see how she feels over the next month to 6 weeks. Continue Wellbutrin XL 150 mg every morning. Continue B complex, vitamin D, multivitamin daily, omega-3, and co-Q10. Continue gabapentin 300 mg nightly. Continue modafinil 200 mg daily. Therapy with Denise Liu, LPC. 6 weeks or sooner as needed.   Denise Overly, PA-C   This record has been created using [employer].  Chart creation errors have been sought, but may not always have been located and corrected. Such creation errors do not reflect on the standard of medical care.

## 2019-01-25 ENCOUNTER — Ambulatory Visit (INDEPENDENT_AMBULATORY_CARE_PROVIDER_SITE_OTHER): Admitting: Physician Assistant

## 2019-01-25 ENCOUNTER — Encounter: Payer: Self-pay | Admitting: Physician Assistant

## 2019-01-25 ENCOUNTER — Other Ambulatory Visit: Payer: Self-pay

## 2019-01-25 DIAGNOSIS — F331 Major depressive disorder, recurrent, moderate: Secondary | ICD-10-CM

## 2019-01-25 DIAGNOSIS — F411 Generalized anxiety disorder: Secondary | ICD-10-CM | POA: Diagnosis not present

## 2019-01-25 MED ORDER — BUPROPION HCL ER (XL) 300 MG PO TB24: 300.0000 mg | ORAL_TABLET | Freq: Every day | ORAL | 0 refills | Status: DC

## 2019-01-25 NOTE — Progress Notes (Signed)
[facility] Med Check  Patient ID: Denise Liu,  MRN: [id number]  PCP: Windy CarinaGray, Erin J, PA-C  Date of Evaluation: 01/25/2019 Time spent:15 minutes  Chief Complaint:  Chief Complaint    Depression     Virtual Visit via Telephone Note  I connected with patient by a video enabled telemedicine application or telephone, with their informed consent, and verified patient privacy and that I am speaking with the correct person using two identifiers.  I am private, in my home and the patient is home.  I discussed the limitations, risks, security and privacy concerns of performing an evaluation and management service by telephone and the availability of in person appointments. I also discussed with the patient that there may be a patient responsible charge related to this service. The patient expressed understanding and agreed to proceed.   I discussed the assessment and treatment plan with the patient. The patient was provided an opportunity to ask questions and all were answered. The patient agreed with the plan and demonstrated an understanding of the instructions.   The patient was advised to call back or seek an in-person evaluation if the symptoms worsen or if the condition fails to improve as anticipated.  I provided 15 minutes of non-face-to-face time during this encounter.  HISTORY/CURRENT STATUS: HPI For routine med check.  At LOV 6 weeks ago, we decreased the Zoloft.  She was on Zoloft 100 mg for many years and would prefer to go back down to that dose if at all possible.  Feels that the addition of Wellbutrin has helped some.  She still has decreased motivation and energy on most days.  The past few weeks have been hard due to the fibromyalgia.  She has not really felt like doing much.  She is not isolating any more than she has to due to the coronavirus pandemic.  She denies suicidal or homicidal thoughts.  She sleeps well most of the time.  Denies anxiety except from what seems  to be normal concerns for the coronavirus pandemic.  Denies dizziness, syncope, seizures, numbness, tingling, tremor, tics, unsteady gait, slurred speech, confusion.  She does report a burning sensation in her feet.  She was prescribed gabapentin for that but it makes her too sleepy.  She will discuss with her PCP next week.  She has chronic muscle pain from fibromyalgia.  Individual Medical History/ Review of Systems: Changes? :No    Past medications for mental health diagnoses include: Topamax for migraines  Allergies: Codeine and Topamax [topiramate]  Current Medications:  Current Outpatient Medications:  .  atorvastatin (LIPITOR) 10 MG tablet, Take 10 mg by mouth daily., Disp: , Rfl:  .  b complex vitamins capsule, Take 1 capsule by mouth daily., Disp: , Rfl:  .  calcium carbonate 200 MG capsule, Take 600 mg by mouth daily., Disp: , Rfl:  .  Cholecalciferol (VITAMIN D3) 2000 UNITS TABS, Take 4,000 Int'l Units by mouth., Disp: , Rfl:  .  co-enzyme Q-10 50 MG capsule, Take 300 mg by mouth daily., Disp: , Rfl:  .  cyclobenzaprine (FLEXERIL) 10 MG tablet, Take 10 mg by mouth 3 (three) times daily as needed for muscle spasms., Disp: , Rfl:  .  fluticasone (FLONASE) 50 MCG/ACT nasal spray, 1 spray by Each Nare route daily., Disp: , Rfl:  .  gabapentin (NEURONTIN) 300 MG capsule, at bedtime as needed. , Disp: , Rfl:  .  losartan (COZAAR) 25 MG tablet, Take 25 mg by mouth daily., Disp: , Rfl:  .  Misc Natural Products (TART CHERRY ADVANCED PO), Take by mouth., Disp: , Rfl:  .  Multiple Vitamins-Minerals (MULTIVITAMIN & MINERAL PO), Take by mouth., Disp: , Rfl:  .  naproxen sodium (ANAPROX) 220 MG tablet, Take 220 mg by mouth as needed., Disp: , Rfl:  .  omeprazole (PRILOSEC) 20 MG capsule, TAKE 1 CAPSULE DAILY, Disp: , Rfl:  .  sertraline (ZOLOFT) 100 MG tablet, Take 150 mg by mouth daily. , Disp: , Rfl:  .  buPROPion (WELLBUTRIN XL) 300 MG 24 hr tablet, Take 1 tablet (300 mg total) by mouth  daily., Disp: 90 tablet, Rfl: 0 .  Glucosamine-Chondroit-Vit C-Mn (GLUCOSAMINE 1500 COMPLEX PO), Take by mouth., Disp: , Rfl:  .  lidocaine (LIDODERM) 5 %, Place 1 patch onto the skin daily. Remove & Discard patch within 12 hours or as directed by MD, Disp: , Rfl:  .  Magnesium 250 MG TABS, Take by mouth., Disp: , Rfl:  .  MANGANESE PO, Take by mouth., Disp: , Rfl:  .  modafinil (PROVIGIL) 100 MG tablet, Take 200 mg by mouth daily., Disp: , Rfl:  .  Omega-3 Fatty Acids (FISH OIL) 1200 MG CAPS, Take 2,400 mg by mouth., Disp: , Rfl:  .  traMADol (ULTRAM) 50 MG tablet, Take 50 mg by mouth every 6 (six) hours as needed for pain., Disp: , Rfl:  Medication Side Effects: none  Family Medical/ Social History: Changes? No  MENTAL HEALTH EXAM:  There were no vitals taken for this visit.There is no height or weight on file to calculate BMI.  General Appearance: unable to assess  Eye Contact:  unable to assess  Speech:  Clear and Coherent  Volume:  Normal  Mood:  Euthymic  Affect:  unable to assess  Thought Process:  Goal Directed  Orientation:  Full (Time, Place, and Person)  Thought Content: Logical   Suicidal Thoughts:  No  Homicidal Thoughts:  No  Memory:  WNL  Judgement:  Good  Insight:  Good  Psychomotor Activity:  unable to assess  Concentration:  Concentration: Good  Recall:  Good  Fund of Knowledge: Good  Language: Good  Assets:  Desire for Improvement  ADL's:  Intact  Cognition: WNL  Prognosis:  Good    DIAGNOSES:    ICD-10-CM   1. Major depressive disorder, recurrent episode, moderate (HCC) F33.1   2. Generalized anxiety disorder F41.1     Receiving Psychotherapy: No    RECOMMENDATIONS: Increase Wellbutrin XL to 300 mg p.o. every morning. Decrease Zoloft to 100 mg p.o. daily.  She really wants to go back to as low as possible.  Her PCP prescribes the Zoloft. Continue modafinil 200 mg p.o. daily Continue multivitamin daily, B complex, fish oil, co-Q10,  calcium. Return in 6 weeks.  Melony Overly, PA-C   This record has been created using [employer].  Chart creation errors have been sought, but may not always have been located and corrected. Such creation errors do not reflect on the standard of medical care.

## 2019-02-20 ENCOUNTER — Emergency Department (HOSPITAL_BASED_OUTPATIENT_CLINIC_OR_DEPARTMENT_OTHER)
Admission: EM | Admit: 2019-02-20 | Discharge: 2019-02-21 | Disposition: A | Discharge status: Home / Self Care | Attending: Emergency Medicine | Admitting: Emergency Medicine

## 2019-02-20 ENCOUNTER — Encounter (HOSPITAL_BASED_OUTPATIENT_CLINIC_OR_DEPARTMENT_OTHER): Payer: Self-pay | Admitting: Emergency Medicine

## 2019-02-20 ENCOUNTER — Other Ambulatory Visit: Payer: Self-pay

## 2019-02-20 DIAGNOSIS — L299 Pruritus, unspecified: Secondary | ICD-10-CM | POA: Diagnosis present

## 2019-02-20 DIAGNOSIS — T7840XA Allergy, unspecified, initial encounter: Secondary | ICD-10-CM | POA: Diagnosis not present

## 2019-02-20 DIAGNOSIS — I10 Essential (primary) hypertension: Secondary | ICD-10-CM | POA: Diagnosis not present

## 2019-02-20 DIAGNOSIS — Z79899 Other long term (current) drug therapy: Secondary | ICD-10-CM | POA: Insufficient documentation

## 2019-02-20 MED ORDER — FAMOTIDINE 20 MG PO TABS
20.0000 mg | ORAL_TABLET | Freq: Once | ORAL | Status: AC
  Administered 2019-02-20: 20 mg via ORAL
  Filled 2019-02-20: qty 1

## 2019-02-20 MED ORDER — DEXAMETHASONE SODIUM PHOSPHATE 10 MG/ML IJ SOLN
10.0000 mg | Freq: Once | INTRAMUSCULAR | Status: AC
  Administered 2019-02-20: 10 mg via INTRAMUSCULAR
  Filled 2019-02-20: qty 1

## 2019-02-20 MED ORDER — FAMOTIDINE 20 MG PO TABS: 20.0000 mg | ORAL_TABLET | Freq: Two times a day (BID) | ORAL | 0 refills | Status: DC

## 2019-02-20 MED ORDER — PREDNISONE 20 MG PO TABS: ORAL_TABLET | ORAL | 0 refills | Status: DC
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
## 2019-02-20 NOTE — ED Provider Notes (Signed)
[facility] Provider Note   CSN: [id number] Arrival date & time: 02/20/19  2319     History   Chief Complaint Chief Complaint  Patient presents with  . Pruritis  . Rash    HPI Denise Liu is a 63 y.o. female.     The history is provided by the patient.  Rash Location:  Hand and shoulder/arm Shoulder/arm rash location:  R forearm, L forearm, R wrist, L wrist, L hand and R hand Hand rash location:  R hand and L hand Quality: itchiness   Quality: not blistering, not bruising, not burning, not painful and not peeling   Severity:  Moderate Onset quality:  Gradual Timing:  Constant Progression:  Unchanged Chronicity:  New Context: insect bite/sting   Context comment:  Mosquito bite to the left thigh Relieved by:  Nothing Worsened by:  Nothing Ineffective treatments:  Antihistamines Associated symptoms: no fever, no shortness of breath, no sore throat, no throat swelling, no tongue swelling and not wheezing   Sitting in chair at home and a mosquito bit her now itching and hives.  Took 2 benadryl PTA and starting to feel better.    Past Medical History:  Diagnosis Date  . Anxiety   . Fibromyalgia   . Headache   . Hyperlipidemia   . Hypertension   . Obesity     There are no active problems to display for this patient.   Past Surgical History:  Procedure Laterality Date  . EYE SURGERY     repaired 'cross-eyes'     OB History   No obstetric history on file.      Home Medications    Prior to Admission medications   Medication Sig Start Date End Date Taking? Authorizing Provider  atorvastatin (LIPITOR) 10 MG tablet Take 10 mg by mouth daily.    [provider]  b complex vitamins capsule Take 1 capsule by mouth daily.    [provider]  buPROPion (WELLBUTRIN XL) 300 MG 24 hr tablet Take 1 tablet (300 mg total) by mouth daily. 01/25/19   Donnal Moat T, PA-C  calcium carbonate 200 MG capsule Take 600 mg  by mouth daily.    [provider]  Cholecalciferol (VITAMIN D3) 2000 UNITS TABS Take 4,000 Int'l Units by mouth.    [provider]  co-enzyme Q-10 50 MG capsule Take 300 mg by mouth daily.    [provider]  cyclobenzaprine (FLEXERIL) 10 MG tablet Take 10 mg by mouth 3 (three) times daily as needed for muscle spasms.    [provider]  fluticasone (FLONASE) 50 MCG/ACT nasal spray 1 spray by Each Nare route daily. 04/10/17   [provider]  gabapentin (NEURONTIN) 300 MG capsule at bedtime as needed.  09/21/18   [provider]  Glucosamine-Chondroit-Vit C-Mn (GLUCOSAMINE 1500 COMPLEX PO) Take by mouth.    [provider]  lidocaine (LIDODERM) 5 % Place 1 patch onto the skin daily. Remove & Discard patch within 12 hours or as directed by MD    [provider]  losartan (COZAAR) 25 MG tablet Take 25 mg by mouth daily.    [provider]  Magnesium 250 MG TABS Take by mouth.    [provider]  MANGANESE PO Take by mouth.    [provider]  Misc Natural Products (TART CHERRY ADVANCED PO) Take by mouth.    [provider]  modafinil (PROVIGIL) 100 MG tablet Take 200 mg by mouth  daily.    [provider]  Multiple Vitamins-Minerals (MULTIVITAMIN & MINERAL PO) Take by mouth.    [provider]  naproxen sodium (ANAPROX) 220 MG tablet Take 220 mg by mouth as needed.    [provider]  Omega-3 Fatty Acids (FISH OIL) 1200 MG CAPS Take 2,400 mg by mouth.    [provider]  omeprazole (PRILOSEC) 20 MG capsule TAKE 1 CAPSULE DAILY 04/10/17   [provider]  sertraline (ZOLOFT) 100 MG tablet Take 100 mg by mouth daily.    [provider]  traMADol (ULTRAM) 50 MG tablet Take 50 mg by mouth every 6 (six) hours as needed for pain.    [provider]    Family History Family History  Problem Relation Age of Onset  . Asthma Other   .  Hyperlipidemia Other   . Hypertension Other   . Obesity Other   . Heart attack Other   . CVA Mother   . CAD Mother   . Osteoarthritis Mother   . CAD Father   . Alcoholism Father   . Hypertension Sister   . Depression Sister   . Alcoholism Sister   . CAD Brother   . CAD Maternal Grandmother   . Osteoarthritis Maternal Grandmother   . Prostate cancer Maternal Grandfather   . CAD Maternal Grandfather   . COPD Brother   . Diabetes Brother   . Hypertension Sister   . Healthy Sister   . Depression Son   . Healthy Daughter     Social History Social History   Tobacco Use  . Smoking status: Never Smoker  . Smokeless tobacco: Never Used  Substance Use Topics  . Alcohol use: Yes    Alcohol/week: 1.0 standard drinks    Types: 1 Glasses of wine per week    Comment: per month  . Drug use: Never     Allergies   Codeine and Topamax [topiramate]   Review of Systems Review of Systems  Constitutional: Negative for fever.  HENT: Negative for facial swelling and sore throat.   Respiratory: Negative for shortness of breath and wheezing.   Skin: Positive for rash.  All other systems reviewed and are negative.    Physical Exam Updated Vital Signs BP (!) 144/101 (BP Location: Right Arm)   Pulse 92   Temp 98 F (36.7 C) (Tympanic)   Resp 20   SpO2 96%   Physical Exam Vitals signs and nursing note reviewed.  Constitutional:      General: She is not in acute distress.    Appearance: She is normal weight.  HENT:     Head: Normocephalic and atraumatic.     Nose: Nose normal.     Mouth/Throat:     Comments: No swelling of the lips tongue or uvula Eyes:     Conjunctiva/sclera: Conjunctivae normal.     Pupils: Pupils are equal, round, and reactive to light.  Neck:     Musculoskeletal: Normal range of motion and neck supple.  Cardiovascular:     Rate and Rhythm: Normal rate and regular rhythm.     Pulses: Normal pulses.     Heart sounds: Normal heart sounds.   Pulmonary:     Effort: Pulmonary effort is normal. No respiratory distress.     Breath sounds: Normal breath sounds. No stridor. No wheezing.  Abdominal:     General: Abdomen is flat. Bowel sounds are normal.     Tenderness: There is no abdominal tenderness.  Musculoskeletal:  Normal range of motion.  Skin:    General: Skin is warm and dry.     Capillary Refill: Capillary refill takes less than 2 seconds.       Neurological:     General: No focal deficit present.     Mental Status: She is alert and oriented to person, place, and time.  Psychiatric:        Mood and Affect: Mood normal.        Behavior: Behavior normal.      ED Treatments / Results  Labs (all labs ordered are listed, but only abnormal results are displayed) Labs Reviewed - No data to display  EKG    Radiology No results found.  Procedures Procedures (including critical care time)  Medications Ordered in ED Medications  dexamethasone (DECADRON) injection 10 mg (10 mg Intramuscular Given 02/20/19 2342)  famotidine (PEPCID) tablet 20 mg (20 mg Oral Given 02/20/19 2342)      Final Clinical Impressions(s) / ED Diagnoses   Return for intractable cough, coughing up blood,fevers >100.4 unrelieved by medication, shortness of breath, intractable vomiting, chest pain, shortness of breath, weakness,numbness, changes in speech, facial asymmetry,abdominal pain, passing out,Inability to tolerate liquids or food, cough, altered mental status or any concerns. No signs of systemic illness or infection. The patient is nontoxic-appearing on exam and vital signs are within normal limits.   I have reviewed the triage vital signs and the nursing notes. Pertinent labs &imaging results that were available during my care of the patient were reviewed by me and considered in my medical decision making (see chart for details).  After history, exam, and medical workup I feel the patient has been appropriately medically  screened and is safe for discharge home. Pertinent diagnoses were discussed with the patient. Patient was given return precautions       Likisha Alles, MD 02/20/19 2352

## 2019-02-21 ENCOUNTER — Encounter (HOSPITAL_BASED_OUTPATIENT_CLINIC_OR_DEPARTMENT_OTHER): Payer: Self-pay | Admitting: Emergency Medicine

## 2019-03-08 ENCOUNTER — Ambulatory Visit (INDEPENDENT_AMBULATORY_CARE_PROVIDER_SITE_OTHER): Admitting: Physician Assistant

## 2019-03-08 ENCOUNTER — Other Ambulatory Visit: Payer: Self-pay

## 2019-03-08 ENCOUNTER — Encounter: Payer: Self-pay | Admitting: Physician Assistant

## 2019-03-08 DIAGNOSIS — F331 Major depressive disorder, recurrent, moderate: Secondary | ICD-10-CM | POA: Diagnosis not present

## 2019-03-08 DIAGNOSIS — F411 Generalized anxiety disorder: Secondary | ICD-10-CM | POA: Diagnosis not present

## 2019-03-08 NOTE — Progress Notes (Signed)
[facility]  Patient ID: Denise Liu,  MRN: [id number]  PCP: Rosita Kea, PA-C  Date of Evaluation: 03/08/2019 Time spent:15 minutes  Chief Complaint:  Chief Complaint    Follow-up      HISTORY/CURRENT STATUS: HPI For routine med check.   Is feeling better w/ energy and mood. She realized she's supposed to be taking her blood pressure medication at night.  That has helped with some of the fatigue.  At the last visit we increased the Wellbutrin and decreased the Zoloft which also has seemed to have helped.  States she feels well and that the meds are working good for her now.  Patient denies loss of interest in usual activities and is able to enjoy things.  Denies decreased energy or motivation.  Appetite has not changed.  No extreme sadness, tearfulness, or feelings of hopelessness.  Denies any changes in concentration, making decisions or remembering things.  Denies suicidal or homicidal thoughts.  Denies anxiety to speak of.  She was originally put on Zoloft due to the anxiety.  She asks if she needs to stay on that as well as the Wellbutrin.  She has more energy and is able to focus well too with the modafinil.  Denies dizziness, syncope, seizures, numbness, tingling, tremor, tics, unsteady gait, slurred speech, confusion. Denies muscle or joint pain, stiffness, or dystonia.  Individual Medical History/ Review of Systems: Changes? :No   Allergies: Codeine and Topamax [topiramate]  Current Medications:  Current Outpatient Medications:  .  atorvastatin (LIPITOR) 10 MG tablet, Take 10 mg by mouth daily., Disp: , Rfl:  .  b complex vitamins capsule, Take 1 capsule by mouth daily., Disp: , Rfl:  .  buPROPion (WELLBUTRIN XL) 300 MG 24 hr tablet, Take 1 tablet (300 mg total) by mouth daily., Disp: 90 tablet, Rfl: 0 .  calcium carbonate 200 MG capsule, Take 600 mg by mouth daily., Disp: , Rfl:  .  Cholecalciferol (VITAMIN D3) 2000 UNITS TABS, Take 4,000 Int'l Units  by mouth., Disp: , Rfl:  .  co-enzyme Q-10 50 MG capsule, Take 300 mg by mouth daily., Disp: , Rfl:  .  cyclobenzaprine (FLEXERIL) 10 MG tablet, Take 10 mg by mouth 3 (three) times daily as needed for muscle spasms., Disp: , Rfl:  .  fluticasone (FLONASE) 50 MCG/ACT nasal spray, 1 spray by Each Nare route daily., Disp: , Rfl:  .  Glucosamine-Chondroit-Vit C-Mn (GLUCOSAMINE 1500 COMPLEX PO), Take by mouth., Disp: , Rfl:  .  lidocaine (LIDODERM) 5 %, Place 1 patch onto the skin daily. Remove & Discard patch within 12 hours or as directed by MD, Disp: , Rfl:  .  losartan (COZAAR) 25 MG tablet, Take 25 mg by mouth daily., Disp: , Rfl:  .  Magnesium 250 MG TABS, Take by mouth., Disp: , Rfl:  .  MANGANESE PO, Take by mouth., Disp: , Rfl:  .  Misc Natural Products (TART CHERRY ADVANCED PO), Take by mouth., Disp: , Rfl:  .  modafinil (PROVIGIL) 100 MG tablet, Take 200 mg by mouth daily., Disp: , Rfl:  .  Multiple Vitamins-Minerals (MULTIVITAMIN & MINERAL PO), Take by mouth., Disp: , Rfl:  .  naproxen sodium (ANAPROX) 220 MG tablet, Take 220 mg by mouth as needed., Disp: , Rfl:  .  Omega-3 Fatty Acids (FISH OIL) 1200 MG CAPS, Take 2,400 mg by mouth., Disp: , Rfl:  .  omeprazole (PRILOSEC) 20 MG capsule, TAKE 1 CAPSULE DAILY, Disp: , Rfl:  .  sertraline (ZOLOFT) 100 MG tablet, Take 100 mg by mouth daily., Disp: , Rfl:  .  traMADol (ULTRAM) 50 MG tablet, Take 50 mg by mouth every 6 (six) hours as needed for pain., Disp: , Rfl:  .  famotidine (PEPCID) 20 MG tablet, Take 1 tablet (20 mg total) by mouth 2 (two) times daily. (Patient not taking: Reported on 03/08/2019), Disp: 30 tablet, Rfl: 0 .  gabapentin (NEURONTIN) 300 MG capsule, at bedtime as needed. , Disp: , Rfl:  .  predniSONE (DELTASONE) 20 MG tablet, 2 tabs po daily x 3 days (Patient not taking: Reported on 03/08/2019), Disp: 6 tablet, Rfl: 0 Medication Side Effects: none  Family Medical/ Social History: Changes? No  MENTAL HEALTH EXAM:  There were  no vitals taken for this visit.There is no height or weight on file to calculate BMI.  General Appearance: Casual and Obese  Eye Contact:  Good  Speech:  Clear and Coherent  Volume:  Normal  Mood:  Euthymic  Affect:  Appropriate  Thought Process:  Goal Directed  Orientation:  Full (Time, Place, and Person)  Thought Content: Logical   Suicidal Thoughts:  No  Homicidal Thoughts:  No  Memory:  WNL  Judgement:  Good  Insight:  Good  Psychomotor Activity:  Normal  Concentration:  Concentration: Good  Recall:  Good  Fund of Knowledge: Good  Language: Good  Assets:  Desire for Improvement  ADL's:  Intact  Cognition: WNL  Prognosis:  Good    DIAGNOSES:    ICD-10-CM   1. Major depressive disorder, recurrent episode, moderate (HCC)  F33.1   2. Generalized anxiety disorder  F41.1     Receiving Psychotherapy: Yes    RECOMMENDATIONS:  I am glad to see her doing so well! Continue Wellbutrin XL 300 mg daily. Continue gabapentin 300 mg nightly as needed. Continue modafinil 200 mg every morning. Continue Zoloft 100 mg p.o. daily. Continue therapy. Return in 3 months.  Melony Overlyeresa Senon Nixon, PA-C   This record has been created using [employer] software.  Chart creation errors have been sought, but may not always have been located and corrected. Such creation errors do not reflect on the standard of medical care.

## 2019-06-08 ENCOUNTER — Ambulatory Visit: Admitting: Physician Assistant

## 2019-06-09 ENCOUNTER — Encounter: Payer: Self-pay | Admitting: Physician Assistant

## 2019-06-09 ENCOUNTER — Other Ambulatory Visit: Payer: Self-pay

## 2019-06-09 ENCOUNTER — Ambulatory Visit (INDEPENDENT_AMBULATORY_CARE_PROVIDER_SITE_OTHER): Admitting: Physician Assistant

## 2019-06-09 DIAGNOSIS — F411 Generalized anxiety disorder: Secondary | ICD-10-CM | POA: Diagnosis not present

## 2019-06-09 DIAGNOSIS — F331 Major depressive disorder, recurrent, moderate: Secondary | ICD-10-CM

## 2019-06-09 DIAGNOSIS — E039 Hypothyroidism, unspecified: Secondary | ICD-10-CM

## 2019-06-09 MED ORDER — BUPROPION HCL ER (XL) 300 MG PO TB24: 300.0000 mg | ORAL_TABLET | Freq: Every day | ORAL | 0 refills | Status: DC

## 2019-06-09 MED ORDER — BUPROPION HCL ER (XL) 300 MG PO TB24: 300.0000 mg | ORAL_TABLET | Freq: Every day | ORAL | 1 refills | Status: DC

## 2019-06-09 NOTE — Progress Notes (Signed)
[facility]  Patient ID: Denise Liu,  MRN: [id number]  PCP: Rosita Kea, PA-C  Date of Evaluation: 06/09/2019 Time spent:15 minutes  Chief Complaint:  Chief Complaint    Follow-up      HISTORY/CURRENT STATUS: HPI For routine med check.   Still feels low energy and motivation.  She found out last week that her thyroid was a little bit off.  She was started on a low dose of Synthroid.  She already feels better as far as the dizziness goes that she was having.  She feels that was related.  Has seen no effect as far as energy goes however.  Patient denies loss of interest in usual activities and is able to enjoy things.  She is trying to exercise more, walking with a friend.  Appetite has not changed.  No extreme sadness, tearfulness, or feelings of hopelessness.  Denies any changes in concentration, making decisions or remembering things.  Overall, feels better than she did when we initially met.  She would just like to have more energy and motivation.  Denies suicidal or homicidal thoughts.  Denies anxiety to speak of.  She was originally put on Zoloft due to the anxiety.  She asks if she needs to stay on that as well as the Wellbutrin.  She stopped the Modafanil.  It kept her awake and she did not really feel like it was helping that much.  Denies dizziness, syncope, seizures, numbness, tingling, tremor, tics, unsteady gait, slurred speech, confusion. Denies muscle or joint pain, stiffness, or dystonia.  Individual Medical History/ Review of Systems: Changes? :Yes Hypothyroidism recently diagnosed.  Allergies: Codeine and Topamax [topiramate]  Current Medications:  Current Outpatient Medications:  .  atorvastatin (LIPITOR) 10 MG tablet, Take 10 mg by mouth daily., Disp: , Rfl:  .  b complex vitamins capsule, Take 1 capsule by mouth daily., Disp: , Rfl:  .  buPROPion (WELLBUTRIN XL) 300 MG 24 hr tablet, Take 1 tablet (300 mg total) by mouth daily., Disp: 90  tablet, Rfl: 1 .  calcium carbonate 200 MG capsule, Take 600 mg by mouth daily., Disp: , Rfl:  .  Cholecalciferol (VITAMIN D3) 2000 UNITS TABS, Take 4,000 Int'l Units by mouth., Disp: , Rfl:  .  co-enzyme Q-10 50 MG capsule, Take 300 mg by mouth daily., Disp: , Rfl:  .  cyclobenzaprine (FLEXERIL) 10 MG tablet, Take 10 mg by mouth 3 (three) times daily as needed for muscle spasms., Disp: , Rfl:  .  fluticasone (FLONASE) 50 MCG/ACT nasal spray, 1 spray by Each Nare route daily., Disp: , Rfl:  .  Glucosamine-Chondroit-Vit C-Mn (GLUCOSAMINE 1500 COMPLEX PO), Take by mouth., Disp: , Rfl:  .  levothyroxine (SYNTHROID) 25 MCG tablet, Take by mouth., Disp: , Rfl:  .  lidocaine (LIDODERM) 5 %, Place 1 patch onto the skin daily. Remove & Discard patch within 12 hours or as directed by MD, Disp: , Rfl:  .  losartan (COZAAR) 25 MG tablet, Take 25 mg by mouth daily., Disp: , Rfl:  .  Magnesium 250 MG TABS, Take by mouth., Disp: , Rfl:  .  MANGANESE PO, Take by mouth., Disp: , Rfl:  .  Misc Natural Products (TART CHERRY ADVANCED PO), Take by mouth., Disp: , Rfl:  .  Multiple Vitamins-Minerals (MULTIVITAMIN & MINERAL PO), Take by mouth., Disp: , Rfl:  .  naproxen sodium (ANAPROX) 220 MG tablet, Take 220 mg by mouth as needed., Disp: , Rfl:  .  Omega-3 Fatty Acids (  FISH OIL) 1200 MG CAPS, Take 2,400 mg by mouth., Disp: , Rfl:  .  omeprazole (PRILOSEC) 20 MG capsule, TAKE 1 CAPSULE DAILY, Disp: , Rfl:  .  potassium chloride SA (KLOR-CON) 20 MEQ tablet, Take by mouth., Disp: , Rfl:  .  sertraline (ZOLOFT) 100 MG tablet, Take 50 mg by mouth daily. , Disp: , Rfl:  .  buPROPion (WELLBUTRIN XL) 300 MG 24 hr tablet, Take 1 tablet (300 mg total) by mouth daily., Disp: 30 tablet, Rfl: 0 .  gabapentin (NEURONTIN) 300 MG capsule, at bedtime as needed. , Disp: , Rfl:  .  modafinil (PROVIGIL) 100 MG tablet, Take 200 mg by mouth daily., Disp: , Rfl:  .  predniSONE (DELTASONE) 20 MG tablet, 2 tabs po daily x 3 days (Patient  not taking: Reported on 03/08/2019), Disp: 6 tablet, Rfl: 0 .  traMADol (ULTRAM) 50 MG tablet, Take 50 mg by mouth every 6 (six) hours as needed for pain., Disp: , Rfl:  Medication Side Effects: none  Family Medical/ Social History: Changes? No  MENTAL HEALTH EXAM:  There were no vitals taken for this visit.There is no height or weight on file to calculate BMI.  General Appearance: Casual and Obese  Eye Contact:  Good  Speech:  Clear and Coherent  Volume:  Normal  Mood:  Euthymic  Affect:  Appropriate  Thought Process:  Goal Directed and Descriptions of Associations: Intact  Orientation:  Full (Time, Place, and Person)  Thought Content: Logical   Suicidal Thoughts:  No  Homicidal Thoughts:  No  Memory:  WNL  Judgement:  Good  Insight:  Good  Psychomotor Activity:  Normal  Concentration:  Concentration: Good  Recall:  Good  Fund of Knowledge: Good  Language: Good  Assets:  Desire for Improvement  ADL's:  Intact  Cognition: WNL  Prognosis:  Good    DIAGNOSES:    ICD-10-CM   1. Major depressive disorder, recurrent episode, moderate (HCC)  F33.1   2. Generalized anxiety disorder  F41.1   3. Hypothyroidism, unspecified type  E03.9     Receiving Psychotherapy: Yes    RECOMMENDATIONS:  Continue Wellbutrin XL 300 mg daily.  If she is not at least 75% better in about 3 to 4 weeks, she can call and we will increase the Wellbutrin up to 450 mg daily.  If she is feeling much much better after having been on the Synthroid for a month, then we will make no changes. She stopped the gabapentin and modafinil on her own. Continue Zoloft 50 mg p.o. daily.  (We had discussed in the past decreasing it.  She has been on this dose for several months now.) Continue therapy. Return in 3 months.  Donnal Moat, PA-C

## 2019-09-09 ENCOUNTER — Ambulatory Visit (INDEPENDENT_AMBULATORY_CARE_PROVIDER_SITE_OTHER): Admitting: Physician Assistant

## 2019-09-09 ENCOUNTER — Encounter: Payer: Self-pay | Admitting: Physician Assistant

## 2019-09-09 ENCOUNTER — Other Ambulatory Visit: Payer: Self-pay

## 2019-09-09 DIAGNOSIS — F411 Generalized anxiety disorder: Secondary | ICD-10-CM | POA: Diagnosis not present

## 2019-09-09 DIAGNOSIS — F331 Major depressive disorder, recurrent, moderate: Secondary | ICD-10-CM | POA: Diagnosis not present

## 2019-09-09 DIAGNOSIS — E039 Hypothyroidism, unspecified: Secondary | ICD-10-CM | POA: Diagnosis not present

## 2019-09-09 NOTE — Progress Notes (Signed)
[facility]  Patient ID: Denise Liu,  MRN: [id number]  PCP: Windy Carina, PA-C  Date of Evaluation: 09/09/2019 Time spent:20 minutes  Chief Complaint:  Chief Complaint    Depression; Follow-up      HISTORY/CURRENT STATUS: HPI for 59-month med check.  Patient states she is doing really well.  She is feeling a lot better than she has in the past.  She has been on the Synthroid for about 3-1/2 months now and she is feeling a lot better.  She has more energy and a lot more motivation.  She is able to enjoy things.  Does not cry easily.  Focus and attention are good.  She is able to complete tasks.  She sleeps well most of the time.  Denies suicidal or homicidal thoughts.  She would still like to go off of the Zoloft if possible.  She is not having anxiety like she was when we initially started it.  Not working.  Patient denies increased energy with decreased need for sleep, no increased talkativeness, no racing thoughts, no impulsivity or risky behaviors, no increased spending, no increased libido, no grandiosity.  Denies dizziness, syncope, seizures, numbness, tingling, tremor, tics, unsteady gait, slurred speech, confusion. Denies muscle or joint pain, stiffness, or dystonia.  Individual Medical History/ Review of Systems: Changes? :No    Past medications for mental health diagnoses include: Topamax for migraines, modafinil, Zoloft, gabapentin she did not like the way it made her feel, Wellbutrin XL  Allergies: Codeine and Topamax [topiramate]  Current Medications:  Current Outpatient Medications:  .  atorvastatin (LIPITOR) 10 MG tablet, Take 10 mg by mouth daily., Disp: , Rfl:  .  b complex vitamins capsule, Take 1 capsule by mouth daily., Disp: , Rfl:  .  buPROPion (WELLBUTRIN XL) 300 MG 24 hr tablet, Take 1 tablet (300 mg total) by mouth daily., Disp: 90 tablet, Rfl: 1 .  cyclobenzaprine (FLEXERIL) 10 MG tablet, Take 10 mg by mouth 3 (three) times daily as  needed for muscle spasms., Disp: , Rfl:  .  fluticasone (FLONASE) 50 MCG/ACT nasal spray, 1 spray by Each Nare route daily., Disp: , Rfl:  .  levothyroxine (SYNTHROID) 25 MCG tablet, Take by mouth., Disp: , Rfl:  .  losartan (COZAAR) 25 MG tablet, Take 25 mg by mouth daily., Disp: , Rfl:  .  omeprazole (PRILOSEC) 20 MG capsule, TAKE 1 CAPSULE DAILY, Disp: , Rfl:  .  sertraline (ZOLOFT) 100 MG tablet, Take 50 mg by mouth daily. , Disp: , Rfl:  .  calcium carbonate 200 MG capsule, Take 600 mg by mouth daily., Disp: , Rfl:  .  Cholecalciferol (VITAMIN D3) 2000 UNITS TABS, Take 4,000 Int'l Units by mouth., Disp: , Rfl:  .  co-enzyme Q-10 50 MG capsule, Take 300 mg by mouth daily., Disp: , Rfl:  .  Glucosamine-Chondroit-Vit C-Mn (GLUCOSAMINE 1500 COMPLEX PO), Take by mouth., Disp: , Rfl:  .  lidocaine (LIDODERM) 5 %, Place 1 patch onto the skin daily. Remove & Discard patch within 12 hours or as directed by MD, Disp: , Rfl:  .  Magnesium 250 MG TABS, Take by mouth., Disp: , Rfl:  .  MANGANESE PO, Take by mouth., Disp: , Rfl:  .  Misc Natural Products (TART CHERRY ADVANCED PO), Take by mouth., Disp: , Rfl:  .  modafinil (PROVIGIL) 100 MG tablet, Take 200 mg by mouth daily., Disp: , Rfl:  .  Multiple Vitamins-Minerals (MULTIVITAMIN & MINERAL PO), Take by mouth., Disp: ,  Rfl:  .  naproxen sodium (ANAPROX) 220 MG tablet, Take 220 mg by mouth as needed., Disp: , Rfl:  .  Omega-3 Fatty Acids (FISH OIL) 1200 MG CAPS, Take 2,400 mg by mouth., Disp: , Rfl:  .  potassium chloride SA (KLOR-CON) 20 MEQ tablet, Take by mouth., Disp: , Rfl:  .  predniSONE (DELTASONE) 20 MG tablet, 2 tabs po daily x 3 days (Patient not taking: Reported on 03/08/2019), Disp: 6 tablet, Rfl: 0 .  traMADol (ULTRAM) 50 MG tablet, Take 50 mg by mouth every 6 (six) hours as needed for pain., Disp: , Rfl:  Medication Side Effects: none  Family Medical/ Social History: Changes? No  MENTAL HEALTH EXAM:  There were no vitals taken for  this visit.There is no height or weight on file to calculate BMI.  General Appearance: Casual, Neat, Well Groomed and Obese  Eye Contact:  Good  Speech:  Clear and Coherent  Volume:  Normal  Mood:  Euthymic  Affect:  Appropriate  Thought Process:  Goal Directed and Descriptions of Associations: Intact  Orientation:  Full (Time, Place, and Person)  Thought Content: Logical   Suicidal Thoughts:  No  Homicidal Thoughts:  No  Memory:  WNL  Judgement:  Good  Insight:  Good  Psychomotor Activity:  Normal  Concentration:  Concentration: Good  Recall:  Good  Fund of Knowledge: Good  Language: Good  Assets:  Desire for Improvement  ADL's:  Intact  Cognition: WNL  Prognosis:  Good    DIAGNOSES:    ICD-10-CM   1. Major depressive disorder, recurrent episode, moderate (HCC)  F33.1   2. Generalized anxiety disorder  F41.1   3. Hypothyroidism, unspecified type  E03.9     Receiving Psychotherapy: No    RECOMMENDATIONS:  I spent 20 minutes with her face-to-face and we discussed the pros and cons of going off the Zoloft.  I am completely fine with weaning off and seeing how she does.  She will go to 25 mg (approximately.  She will cut a 100 mg pill into quarters) and take that for about 1 week and then stop.  I described the withdrawals to her and if she has any, she can call. She would also like to get off of the Wellbutrin at some point if possible.  We can discuss that further at the next visit.  It could be the reason she feels so much better so I am hesitant to stop it. Continue Wellbutrin XL 300 mg every morning. Return in 6 to 8 weeks.   Donnal Moat, PA-C

## 2019-10-21 ENCOUNTER — Ambulatory Visit (INDEPENDENT_AMBULATORY_CARE_PROVIDER_SITE_OTHER): Admitting: Physician Assistant

## 2019-10-21 ENCOUNTER — Other Ambulatory Visit: Payer: Self-pay

## 2019-10-21 ENCOUNTER — Encounter: Payer: Self-pay | Admitting: Physician Assistant

## 2019-10-21 VITALS — BP 150/102 | HR 80

## 2019-10-21 DIAGNOSIS — R5383 Other fatigue: Secondary | ICD-10-CM

## 2019-10-21 DIAGNOSIS — F411 Generalized anxiety disorder: Secondary | ICD-10-CM | POA: Diagnosis not present

## 2019-10-21 MED ORDER — AMPHETAMINE-DEXTROAMPHETAMINE 10 MG PO TABS: 5.0000 mg | ORAL_TABLET | Freq: Every day | ORAL | 0 refills | Status: DC

## 2019-10-21 NOTE — Progress Notes (Signed)
[facility]  Patient ID: Denise Liu,  MRN: [id number]  PCP: Windy Carina, PA-C  Date of Evaluation: 10/21/2019 Time spent:20 minutes  Chief Complaint:  Chief Complaint    Depression      HISTORY/CURRENT STATUS: HPI For routine med check.  At LOV, we weaned her off the Zoloft and then she stopped the Wellbutrin on her own. "I actually feel a little better being off them. The Wellbutrin was keeping me awake, so I'm sleeping a lot better."  States she doesn't really feel depressed. Enjoys certain things.  It's just that energy and motivation are low. Not crying easily. Can sometimes get distracted easily. No SI/HI.  Patient denies increased energy with decreased need for sleep, no increased talkativeness, no racing thoughts, no impulsivity or risky behaviors, no increased spending, no increased libido, no grandiosity.  She is not having a lot of anxiety.  We discussed whether she could have ADD.  She was never diagnosed because at her age, that was not a "think."  Her 2 kids have ADD though and she has wondered if that could be part of her problem.  Denies dizziness, syncope, seizures, numbness, tingling, tremor, tics, unsteady gait, slurred speech, confusion. Denies muscle or joint pain, stiffness, or dystonia.  Individual Medical History/ Review of Systems: Changes? :No    Past medications for mental health diagnoses include: Topamax for migraines, modafinil, Zoloft, gabapentin she did not like the way it made her feel, Wellbutrin XL  Allergies: Codeine, Wellbutrin [bupropion], and Topamax [topiramate]  Current Medications:  Current Outpatient Medications:  .  atorvastatin (LIPITOR) 10 MG tablet, Take 10 mg by mouth daily., Disp: , Rfl:  .  cyclobenzaprine (FLEXERIL) 10 MG tablet, Take 10 mg by mouth 3 (three) times daily as needed for muscle spasms., Disp: , Rfl:  .  fluticasone (FLONASE) 50 MCG/ACT nasal spray, 1 spray by Each Nare route daily., Disp: , Rfl:   .  levothyroxine (SYNTHROID) 25 MCG tablet, Take by mouth., Disp: , Rfl:  .  lidocaine (LIDODERM) 5 %, Place 1 patch onto the skin daily. Remove & Discard patch within 12 hours or as directed by MD, Disp: , Rfl:  .  losartan (COZAAR) 25 MG tablet, Take 25 mg by mouth daily., Disp: , Rfl:  .  naproxen sodium (ANAPROX) 220 MG tablet, Take 220 mg by mouth as needed., Disp: , Rfl:  .  omeprazole (PRILOSEC) 20 MG capsule, TAKE 1 CAPSULE DAILY, Disp: , Rfl:  .  amphetamine-dextroamphetamine (ADDERALL) 10 MG tablet, Take 0.5-1 tablets (5-10 mg total) by mouth daily with breakfast., Disp: 30 tablet, Rfl: 0 .  b complex vitamins capsule, Take 1 capsule by mouth daily., Disp: , Rfl:  .  buPROPion (WELLBUTRIN XL) 300 MG 24 hr tablet, Take 1 tablet (300 mg total) by mouth daily. (Patient not taking: Reported on 10/21/2019), Disp: 90 tablet, Rfl: 1 .  calcium carbonate 200 MG capsule, Take 600 mg by mouth daily., Disp: , Rfl:  .  Cholecalciferol (VITAMIN D3) 2000 UNITS TABS, Take 4,000 Int'l Units by mouth., Disp: , Rfl:  .  co-enzyme Q-10 50 MG capsule, Take 300 mg by mouth daily., Disp: , Rfl:  .  Glucosamine-Chondroit-Vit C-Mn (GLUCOSAMINE 1500 COMPLEX PO), Take by mouth., Disp: , Rfl:  .  Magnesium 250 MG TABS, Take by mouth., Disp: , Rfl:  .  MANGANESE PO, Take by mouth., Disp: , Rfl:  .  Misc Natural Products (TART CHERRY ADVANCED PO), Take by mouth., Disp: , Rfl:  .  modafinil (PROVIGIL) 100 MG tablet, Take 200 mg by mouth daily., Disp: , Rfl:  .  Multiple Vitamins-Minerals (MULTIVITAMIN & MINERAL PO), Take by mouth., Disp: , Rfl:  .  Omega-3 Fatty Acids (FISH OIL) 1200 MG CAPS, Take 2,400 mg by mouth., Disp: , Rfl:  .  potassium chloride SA (KLOR-CON) 20 MEQ tablet, Take by mouth., Disp: , Rfl:  .  predniSONE (DELTASONE) 20 MG tablet, 2 tabs po daily x 3 days (Patient not taking: Reported on 03/08/2019), Disp: 6 tablet, Rfl: 0 .  sertraline (ZOLOFT) 100 MG tablet, Take 50 mg by mouth daily. , Disp: ,  Rfl:  .  traMADol (ULTRAM) 50 MG tablet, Take 50 mg by mouth every 6 (six) hours as needed for pain., Disp: , Rfl:  Medication Side Effects: none  Family Medical/ Social History: Changes? No  MENTAL HEALTH EXAM:  Blood pressure (!) 150/102, pulse 80.There is no height or weight on file to calculate BMI.  General Appearance: Casual, Neat, Well Groomed and Obese  Eye Contact:  Good  Speech:  Clear and Coherent  Volume:  Normal  Mood:  Euthymic  Affect:  Appropriate  Thought Process:  Goal Directed and Descriptions of Associations: Intact  Orientation:  Full (Time, Place, and Person)  Thought Content: Logical   Suicidal Thoughts:  No  Homicidal Thoughts:  No  Memory:  WNL  Judgement:  Good  Insight:  Good  Psychomotor Activity:  Normal  Concentration:  Concentration: Fair and Attention Span: Fair  Recall:  Good  Fund of Knowledge: Good  Language: Good  Assets:  Desire for Improvement  ADL's:  Intact  Cognition: WNL  Prognosis:  Good    DIAGNOSES:    ICD-10-CM   1. Fatigue, unspecified type  R53.83   2. Generalized anxiety disorder  F41.1     Receiving Psychotherapy: No    RECOMMENDATIONS:  PDMP was reviewed I spent 20 minutes with her. We discussed the fact that a low-dose stimulant might be helpful for her for several different reasons.  It can increase focus, decreased appetite, and help with weight loss.  One of the side effects can be palpitations or elevated blood pressure.  She is already on BP meds and states that when she checks her BP at home or any other doctor's office, it is in normal range.  She would like to try a stimulant.  I checked her blood pressure twice, with 2 different machines and the readings were almost identical.  She will watch her blood pressures at home and if they are staying 150/100 or above, do not take the Adderall.  She verbalizes understanding. Start Adderall 10 mg, 1/2-1 p.o. every morning as needed. Discontinue Wellbutrin XL and  Zoloft, which she has already done. Recommend that she get back on her supplements, especially multivitamin, B complex, fish oil, and co-Q10. Return in 4 to 6 weeks.  Donnal Moat, PA-C

## 2019-11-18 ENCOUNTER — Other Ambulatory Visit: Payer: Self-pay

## 2019-11-18 ENCOUNTER — Encounter: Payer: Self-pay | Admitting: Physician Assistant

## 2019-11-18 ENCOUNTER — Ambulatory Visit (INDEPENDENT_AMBULATORY_CARE_PROVIDER_SITE_OTHER): Admitting: Physician Assistant

## 2019-11-18 VITALS — BP 119/87 | HR 113

## 2019-11-18 DIAGNOSIS — R413 Other amnesia: Secondary | ICD-10-CM | POA: Diagnosis not present

## 2019-11-18 DIAGNOSIS — F331 Major depressive disorder, recurrent, moderate: Secondary | ICD-10-CM | POA: Diagnosis not present

## 2019-11-18 DIAGNOSIS — F411 Generalized anxiety disorder: Secondary | ICD-10-CM | POA: Diagnosis not present

## 2019-11-18 NOTE — Progress Notes (Signed)
[facility]  Patient ID: Denise Liu,  MRN: [id number]  PCP: Windy Carina, PA-C  Date of Evaluation: 11/18/2019 Time spent:20 minutes  Chief Complaint:  Chief Complaint    Anxiety; Depression      HISTORY/CURRENT STATUS: HPI Not doing well.   This week has been bad.  No known triggers. More anxious. Having a hard time focusing, unable to remember things, if she has too many things going on at the same time. Doesn't enjoy things.  Has low energy and motivation.  Puts things off. Isolating but part of it is b/c of covid 'it's getting to me. I don't feel safe going out yet b/c I haven't had the shot yet.'  Cries easily.  She ask if going back on Zoloft would be a good idea.  Looking back, she feels like it helped quite a bit.  Denies SI/HI.  We started Adderall about a month ago.  Not helpful at all.  States BP has been high, 190/85 w/ pulse 105 this morning at home. "It's not always that high.  It's usually around 160/90.  Sleep is messed up.  Gets 8 hours but it's broken up over 24 hours. Doesn't feel rested.  She is retired so she can sleep on average she wants to.  Denies dizziness, syncope, seizures, numbness, tingling, tremor, tics, unsteady gait, slurred speech, confusion. Denies muscle or joint pain, stiffness, or dystonia.  Individual Medical History/ Review of Systems: Changes? :No    Past medications for mental health diagnoses include: Topamax for migraines,modafinil, Zoloft, gabapentin she did not like the way it made her feel, Wellbutrin XL  Allergies: Codeine, Wellbutrin [bupropion], and Topamax [topiramate]  Current Medications:  Current Outpatient Medications:  .  atorvastatin (LIPITOR) 10 MG tablet, Take 10 mg by mouth daily., Disp: , Rfl:  .  b complex vitamins capsule, Take 1 capsule by mouth daily., Disp: , Rfl:  .  calcium carbonate 200 MG capsule, Take 600 mg by mouth daily., Disp: , Rfl:  .  Cholecalciferol (VITAMIN D3) 2000 UNITS TABS,  Take 4,000 Int'l Units by mouth., Disp: , Rfl:  .  co-enzyme Q-10 50 MG capsule, Take 300 mg by mouth daily., Disp: , Rfl:  .  cyclobenzaprine (FLEXERIL) 10 MG tablet, Take 10 mg by mouth 3 (three) times daily as needed for muscle spasms., Disp: , Rfl:  .  fluticasone (FLONASE) 50 MCG/ACT nasal spray, 1 spray by Each Nare route daily., Disp: , Rfl:  .  Glucosamine-Chondroit-Vit C-Mn (GLUCOSAMINE 1500 COMPLEX PO), Take by mouth., Disp: , Rfl:  .  levothyroxine (SYNTHROID) 25 MCG tablet, Take by mouth., Disp: , Rfl:  .  lidocaine (LIDODERM) 5 %, Place 1 patch onto the skin daily. Remove & Discard patch within 12 hours or as directed by MD, Disp: , Rfl:  .  losartan (COZAAR) 25 MG tablet, Take 25 mg by mouth daily., Disp: , Rfl:  .  Magnesium 250 MG TABS, Take by mouth., Disp: , Rfl:  .  MANGANESE PO, Take by mouth., Disp: , Rfl:  .  Misc Natural Products (TART CHERRY ADVANCED PO), Take by mouth., Disp: , Rfl:  .  Multiple Vitamins-Minerals (MULTIVITAMIN & MINERAL PO), Take by mouth., Disp: , Rfl:  .  naproxen sodium (ANAPROX) 220 MG tablet, Take 220 mg by mouth as needed., Disp: , Rfl:  .  Omega-3 Fatty Acids (FISH OIL) 1200 MG CAPS, Take 2,400 mg by mouth., Disp: , Rfl:  .  omeprazole (PRILOSEC) 20 MG capsule, TAKE  1 CAPSULE DAILY, Disp: , Rfl:  .  potassium chloride SA (KLOR-CON) 20 MEQ tablet, Take by mouth., Disp: , Rfl:  .  predniSONE (DELTASONE) 20 MG tablet, 2 tabs po daily x 3 days (Patient not taking: Reported on 03/08/2019), Disp: 6 tablet, Rfl: 0 .  sertraline (ZOLOFT) 100 MG tablet, Take 50 mg by mouth daily. , Disp: , Rfl:  .  traMADol (ULTRAM) 50 MG tablet, Take 50 mg by mouth every 6 (six) hours as needed for pain., Disp: , Rfl:  Medication Side Effects: none  Family Medical/ Social History: Changes? No  MENTAL HEALTH EXAM:  Blood pressure 119/87, pulse (!) 113.There is no height or weight on file to calculate BMI.  General Appearance: Casual, Neat, Well Groomed and Obese  Eye  Contact:  Good  Speech:  Clear and Coherent and Normal Rate  Volume:  Normal  Mood:  Depressed  Affect:  Depressed and Tearful  Thought Process:  Goal Directed and Descriptions of Associations: Intact  Orientation:  Full (Time, Place, and Person)  Thought Content: Logical   Suicidal Thoughts:  No  Homicidal Thoughts:  No  Memory:  Recent;   Fair  Judgement:  Good  Insight:  Good  Psychomotor Activity:  Normal  Concentration:  Concentration: Fair and Attention Span: Fair  Recall:  Good  Fund of Knowledge: Good  Language: Good  Assets:  Desire for Improvement  ADL's:  Intact  Cognition: WNL  Prognosis:  Good    DIAGNOSES:    ICD-10-CM   1. Major depressive disorder, recurrent episode, moderate (HCC)  F33.1   2. Generalized anxiety disorder  F41.1   3. Memory deficit  R41.3     Receiving Psychotherapy: No    RECOMMENDATIONS:  PDMP reviewed. I spent 20 mins w/ her.  Discontinue the Adderall.  It is not effective plus her blood pressure is staying way too high.  She understands and agrees. Restart Zoloft.  She has 100 mg pills at home.  She will start at 1/2 pill daily for 2 weeks and then increase to 1 pill daily. Continue her vitamins and supplements including multivitamin, omega-3, B complex, vitamin D and others.  They are good for brain/mental health as well as physical health. Recommend counseling. Return in 4 to 6 weeks.  Donnal Moat, PA-C

## 2019-12-15 ENCOUNTER — Ambulatory Visit (INDEPENDENT_AMBULATORY_CARE_PROVIDER_SITE_OTHER): Admitting: Psychiatry

## 2019-12-15 ENCOUNTER — Other Ambulatory Visit: Payer: Self-pay

## 2019-12-15 DIAGNOSIS — F331 Major depressive disorder, recurrent, moderate: Secondary | ICD-10-CM

## 2019-12-15 NOTE — Progress Notes (Signed)
[facility] Counselor/Therapist Progress Note  Patient ID: Denise Liu, MRN: [id number],    Date: 12/15/2019  Time Spent: 60 minutes  4:00pm to 5:00pm  Treatment Type: Individual Therapy  Reported Symptoms and Presenting Issues: (Patient saw another therapist several months ago and this is first visit with this therapist.)  Symptoms include: sadness, depression, anxiety, "Lack of purpose",  "negative person" , looks more for the negatives versus positives but seems to deny this and says it's a matter of being a realist,.   Married and has 2 adult kids (1 daughter in [place] and 1 son in [place], [place]).  Is [place] and attend [employer]. Hasn't been able to attend during Covid but looks forward to returning. "I procrastinate a lot".  Reports she is Perfectionistic---if I can't do it perfect, then I don't do it. My Biggest Goal is to be motivated. Watches a lot of TV and I'm living my life through TV characters versus really living my life. Has fibromyalgia so that does contribute to my lack of motivation. "I get bored often." he and husband have thought about downsizing. Her church and [employer] are very important to her. Praying for herself encouraged.    Mental Status Exam:  Appearance:   Casual     Behavior:  Appropriate and Sharing  Motor:  Normal  Speech/Language:   Normal Rate  Affect:  depressed, sad, anxiety  Mood:  anxious, depressed and sad  Thought process:  goal directed  Thought content:    WNL  Sensory/Perceptual disturbances:    WNL  Orientation:  oriented to person, place, time/date, situation, day of week, month of year and year  Attention:  Good  Concentration:  Good  Memory:  short-term memory issues and PCP is aware  Fund of knowledge:   Good  Insight:    Good and Fair  Judgment:   Good  Impulse Control:  Good   Risk Assessment: Danger to Self:  No Self-injurious Behavior: No Danger to Others: No Duty to Warn:no Physical  Aggression / Violence:No  Access to Firearms a concern: No  Gang Involvement:No   Subjective: Patient in today after having seen another therapist several months ago. Didn't really follow through. I really want to be more motivated.   Interventions: Cognitive Behavioral Therapy and Ego-Supportive  Diagnosis:   ICD-10-CM   1. Major depressive disorder, recurrent episode, moderate (HCC)  F33.1     Plan:  Patient not signing Tx Plan on computer screen due to Covid.  Treatment Goals: Goals remain on Tx Plan as patient works with strategies to achieve her goals.  Progress will be documented each session under "Progress" section of Plan.  Long Term goal: Develop healthy cognitive patterns and beliefs about self and the world that lead to alleviation and help to prevent relapse of depression.  Short Term goal: Identify and replace depressive thinking that leads to depressive feelings and actions.  Strategy: Educate the patient about cognitive restructuring including self-monitoring of automatic thoughts reflecting depressive and anxious beliefs.   Progress: Today is first session with this patient and she provided a lot of valuable history and information as to changes she would like to make, how she would like to feel better and healthier, as well as the fact that she is very difficult to get motivated and hasn't previously been interested in "homework" in therapy but she also added that she felt prior homework in times of previous therapy did not seem to fit her needs so she  did not do it.  Discussed problem areas and symptoms noted above very openly and worked together on a treatment goal plan. She agreed to some homework relative to her initial goals above and involved her working on her priorities, relative to making changes.  Also agreed to get out of the house some each day.  She spoke of her faith being very important to her and added that she prays for people and situations frequently  but never for herself.  She is to begin praying specifically for herself daily, in addition to her prayers for others. Will follow up on this next session. (Patient to be out of town approx 2-3 weeks).  Review of goals established today with patient.  Next visit to be in approx. 3 weeks after patient returns home from vacation away.    Shanon Ace, LCSW

## 2019-12-16 ENCOUNTER — Ambulatory Visit: Admitting: Psychiatry

## 2019-12-23 ENCOUNTER — Ambulatory Visit (INDEPENDENT_AMBULATORY_CARE_PROVIDER_SITE_OTHER): Admitting: Physician Assistant

## 2019-12-23 ENCOUNTER — Other Ambulatory Visit: Payer: Self-pay

## 2019-12-23 ENCOUNTER — Encounter: Payer: Self-pay | Admitting: Physician Assistant

## 2019-12-23 DIAGNOSIS — R413 Other amnesia: Secondary | ICD-10-CM

## 2019-12-23 DIAGNOSIS — F411 Generalized anxiety disorder: Secondary | ICD-10-CM

## 2019-12-23 DIAGNOSIS — F331 Major depressive disorder, recurrent, moderate: Secondary | ICD-10-CM | POA: Diagnosis not present

## 2019-12-23 NOTE — Progress Notes (Signed)
[facility]  Patient ID: Denise Liu,  MRN: [id number]  PCP: Windy Carina, PA-C  Date of Evaluation: 12/23/2019 Time spent:20 minutes  Chief Complaint:  Chief Complaint    Depression      HISTORY/CURRENT STATUS: HPI For routine f/u.  States she is doing well.  About 5 weeks ago, we restarted the Zoloft.  She has been back on the Zoloft 100 mg for 2 to 3 weeks now.  Prior to that she was taking 50 mg.  States she feels a lot better.  She is able to enjoy things.  Will be on vacation for the next 2 weeks and is excited about that.  Feels a little more motivated, short-term memory is not quite as bad as it had been.  Not isolating.  No SI/HI.  She is sleeping enough hours but does states she stays up late and then wants to sleep late.  Does not need any medications to help with that.  States she needs to change her habit of staying up late.  She saw Rockne Menghini, LCSW last week.  She is looking forward to continuing therapy with her.  Denies dizziness, syncope, seizures, numbness, tingling, tremor, tics, unsteady gait, slurred speech, confusion. Denies muscle or joint pain, stiffness, or dystonia.  Individual Medical History/ Review of Systems: Changes? :No    Past medications for mental health diagnoses include: Topamax for migraines,modafinil, Zoloft, gabapentin she did not like the way it made her feel, Wellbutrin XL  Allergies: Codeine, Wellbutrin [bupropion], and Topamax [topiramate]  Current Medications:  Current Outpatient Medications:  .  atorvastatin (LIPITOR) 10 MG tablet, Take 10 mg by mouth daily., Disp: , Rfl:  .  b complex vitamins capsule, Take 1 capsule by mouth daily., Disp: , Rfl:  .  calcium carbonate 200 MG capsule, Take 600 mg by mouth daily., Disp: , Rfl:  .  Cholecalciferol (VITAMIN D3) 2000 UNITS TABS, Take 4,000 Int'l Units by mouth., Disp: , Rfl:  .  co-enzyme Q-10 50 MG capsule, Take 300 mg by mouth daily., Disp: , Rfl:  .   cyclobenzaprine (FLEXERIL) 10 MG tablet, Take 10 mg by mouth 3 (three) times daily as needed for muscle spasms., Disp: , Rfl:  .  fluticasone (FLONASE) 50 MCG/ACT nasal spray, 1 spray by Each Nare route daily., Disp: , Rfl:  .  Glucosamine-Chondroit-Vit C-Mn (GLUCOSAMINE 1500 COMPLEX PO), Take by mouth., Disp: , Rfl:  .  levothyroxine (SYNTHROID) 25 MCG tablet, Take by mouth., Disp: , Rfl:  .  lidocaine (LIDODERM) 5 %, Place 1 patch onto the skin daily. Remove & Discard patch within 12 hours or as directed by MD, Disp: , Rfl:  .  losartan (COZAAR) 25 MG tablet, Take 25 mg by mouth daily., Disp: , Rfl:  .  Magnesium 250 MG TABS, Take by mouth., Disp: , Rfl:  .  MANGANESE PO, Take by mouth., Disp: , Rfl:  .  Misc Natural Products (TART CHERRY ADVANCED PO), Take by mouth., Disp: , Rfl:  .  Multiple Vitamins-Minerals (MULTIVITAMIN & MINERAL PO), Take by mouth., Disp: , Rfl:  .  naproxen sodium (ANAPROX) 220 MG tablet, Take 220 mg by mouth as needed., Disp: , Rfl:  .  Omega-3 Fatty Acids (FISH OIL) 1200 MG CAPS, Take 2,400 mg by mouth., Disp: , Rfl:  .  omeprazole (PRILOSEC) 20 MG capsule, TAKE 1 CAPSULE DAILY, Disp: , Rfl:  .  sertraline (ZOLOFT) 100 MG tablet, Take 100 mg by mouth daily. , Disp: ,  Rfl:  .  traMADol (ULTRAM) 50 MG tablet, Take 50 mg by mouth every 6 (six) hours as needed for pain., Disp: , Rfl:  .  potassium chloride SA (KLOR-CON) 20 MEQ tablet, Take by mouth., Disp: , Rfl:  .  predniSONE (DELTASONE) 20 MG tablet, 2 tabs po daily x 3 days (Patient not taking: Reported on 03/08/2019), Disp: 6 tablet, Rfl: 0 Medication Side Effects: none  Family Medical/ Social History: Changes? No  MENTAL HEALTH EXAM:  There were no vitals taken for this visit.There is no height or weight on file to calculate BMI.  General Appearance: Casual, Neat, Well Groomed and Obese  Eye Contact:  Good  Speech:  Clear and Coherent  Volume:  Normal  Mood:  Euthymic  Affect:  Appropriate  Thought Process:   Goal Directed and Descriptions of Associations: Intact  Orientation:  Full (Time, Place, and Person)  Thought Content: Logical   Suicidal Thoughts:  No  Homicidal Thoughts:  No  Memory:  Recent;   Decreased but immediate and remote are normal.  Judgement:  Good  Insight:  Good  Psychomotor Activity:  Normal  Concentration:  Concentration: Good  Recall:  Good  Fund of Knowledge: Good  Language: Good  Assets:  Desire for Improvement  ADL's:  Intact  Cognition: WNL  Prognosis:  Good    DIAGNOSES:    ICD-10-CM   1. Major depressive disorder, recurrent episode, moderate (HCC)  F33.1   2. Generalized anxiety disorder  F41.1   3. Memory deficit  R41.3     Receiving Psychotherapy: Yes Rinaldo Cloud, LCSW  RECOMMENDATIONS:  PDMP was reviewed. I spent 20 minutes with her. I am glad to see her doing so much better! Continue Zoloft 100 mg, 1 p.o. daily. Continue therapy with Rinaldo Cloud, LCSW. Return in 3 months.  Donnal Moat, PA-C

## 2019-12-26 ENCOUNTER — Ambulatory Visit: Admitting: Physician Assistant

## 2020-01-05 ENCOUNTER — Ambulatory Visit: Admitting: Psychiatry

## 2020-01-16 ENCOUNTER — Other Ambulatory Visit: Payer: Self-pay

## 2020-01-16 ENCOUNTER — Ambulatory Visit (INDEPENDENT_AMBULATORY_CARE_PROVIDER_SITE_OTHER): Admitting: Psychiatry

## 2020-01-16 DIAGNOSIS — F331 Major depressive disorder, recurrent, moderate: Secondary | ICD-10-CM | POA: Diagnosis not present

## 2020-01-16 NOTE — Progress Notes (Signed)
[facility] Counselor/Therapist Progress Note  Patient ID: Denise Liu, MRN: [id number],    Date: 01/16/2020  Time Spent:  60 minutes  3:00pm to 4:00pm  Treatment Type: Individual Therapy  Reported Symptoms: depression (is some "better"), low motivation, "anxiety,but very much improved as medication seems to be helping."  Mental Status Exam:  Appearance:   Neat     Behavior:  Appropriate and Sharing  Motor:  Normal  Speech/Language:   Normal Rate  Affect:  Depressed and anxious but improved  Mood:  anxious and depressed but improved  Thought process:  normal  Thought content:    WNL  Sensory/Perceptual disturbances:    WNL  Orientation:  oriented to person, place, time/date, situation, day of week, month of year and year  Attention:  Good  Concentration:  Good  Memory:  WNL  Fund of knowledge:   Good  Insight:    Good  Judgment:   Good  Impulse Control:  Good   Risk Assessment: Danger to Self:  No Self-injurious Behavior: No Danger to Others: No Duty to Warn:no Physical Aggression / Violence:No  Access to Firearms a concern: No  Gang Involvement:No   Subjective: Patient today reporting symptoms of depression, anxiety, and low motivation, some relationship issues.  Interventions: Cognitive Behavioral Therapy and Solution-Oriented/Positive Psychology  Diagnosis:   ICD-10-CM   1. Major depressive disorder, recurrent episode, moderate ([place])  F33.1     Plan:  Patient not signing Tx Plan on computer screen due to Covid.  Treatment Goals: Goals remain on Tx Plan as patient works with strategies to achieve her goals.  Progress will be documented each session under "Progress" section of Plan.  Long Term goal: Develop healthy cognitive patterns and beliefs about self and the world that lead to alleviation and help to prevent relapse of depression.  Short Term goal: Identify and replace depressive thinking that leads to depressive feelings and  actions.  Strategy: Educate the patient about cognitive restructuring including self-monitoring of automatic thoughts reflecting depressive and anxious beliefs.   Progress: Patient in today with depression, low motivation, and some anxiety but it is "better".  Sharing some info below that she didn't share first session. Spoke of some of her traits including: people pleasing, judging myself, perfectionism and excessive expectations.  "Perfection is my standard and my goal."Wants to be acknowledged and that's probably "one of my love languages" and "I don't get that or think that I don't  since my expecations include perfection." Doesn't feel husband understands, but also she acknowledges that she is very critical of most things. I've been accused of "being threatening", not literally, but emotionally. My siblings used to tell me "I'm rude".  Has 5 siblings with some distance between them in their relationship. My mother, prior to her death, held all the family together and now we don't have her so relationships are "loose".  Processed her Perfectionism at length and that does seem to be closely linked to everything in life.  Also sees it as a deterrent to increased motivation but has not seen is as being a problem for her. Reviewed some strategies mentioned last session and is to begin to recognize and interrupt her "perfectionistic statements/thoughts" and realize they are not accurate as no person is Perfect. Does see some possible connection between her perfectionism and depressed feelings, and some difficulties in relationships.  Patient states: I'm trying to be a little more active.. Still hard to get out of that chair sometimes. We were  on vacation last week and that helped..I didn't feel guilty not doing much since we were on vacation.  Her 3 goals: (from a homework assignment) "I haven't started these yet": Call or visit someone every day. Walk at least 15 minutes x3 weekly. Get to sleep  by midnight.  Goal review and progress noted with patient.  Next appt within 2-3 weeks.   Mathis Fare, LCSW

## 2020-01-31 ENCOUNTER — Ambulatory Visit (INDEPENDENT_AMBULATORY_CARE_PROVIDER_SITE_OTHER): Admitting: Psychiatry

## 2020-01-31 ENCOUNTER — Other Ambulatory Visit: Payer: Self-pay

## 2020-01-31 DIAGNOSIS — F331 Major depressive disorder, recurrent, moderate: Secondary | ICD-10-CM

## 2020-01-31 NOTE — Progress Notes (Signed)
°      [facility] Counselor/Therapist Progress Note  Patient ID: Dagmar Adcox, MRN: [id number],    Date: 01/31/2020  Time Spent: 60 minutes  1:00pm to 2:00pm  Treatment Type: Individual Therapy  Reported Symptoms: depression, anxiety, low motivation, low self-esteem, difficulty trusting that others like her  Mental Status Exam:  Appearance:   Casual     Behavior:  Appropriate and Sharing  Motor:  Normal  Speech/Language:   Normal Rate  Affect:  anxious, depressed  Mood:  anxious and depressed  Thought process:  goal directed  Thought content:    WNL  Sensory/Perceptual disturbances:    WNL  Orientation:  oriented to person, place, time/date, situation, day of week, month of year and year  Attention:  Good  Concentration:  Good  Memory:  WNL  Fund of knowledge:   Good  Insight:    Good and Fair  Judgment:   Good and Fair  Impulse Control:  Good   Risk Assessment: Danger to Self:  No Self-injurious Behavior: No Danger to Others: No Duty to Warn:no Physical Aggression / Violence:No  Access to Firearms a concern: No  Gang Involvement:No   Subjective:  Patient today reporting anxiety, depression, feeling apart from others, low self-esteem. Low motivation.  Interventions: Solution-Oriented/Positive Psychology and Ego-Supportive  Diagnosis:   ICD-10-CM   1. Major depressive disorder, recurrent episode, moderate ([facility])  F33.1     Plan: Patient not signing Tx Plan on computer screen due to Covid.  Treatment Goals: Goals remain on Tx Plan as patient works with strategies to achieve her goals. Progress will be documented each session under "Progress" section of Plan.  Long Term goal: Develop healthy cognitive patterns and beliefs about self and the world that lead to alleviation and help to prevent relapse of depression.  Short Term goal: Identify and replace depressive thinking that leads to depressive feelings and actions.  Strategy: Educate the patient  about cognitive restructuring including self-monitoring of automatic thoughts reflecting depressive and anxious beliefs.   Progress: Patient today in reporting anxiety, depression, feeling apart or not included with others, low motivation, and low self esteem.  Assumes others may not like her or want her (this is a long standing feeling, not just in adulthood.)  Procrastinates, waiting til last minute. Went with a church group on a trip recently and that was stressful but some good moments for patient. Wasn't sure she fit in, "but not to the extent that I do on some occasions." Talked more today about her perfectionism ("my standard and my goal").  Judgmental of self, feels guilty often. Worked hard today on her low self-esteem and low motivation. Talked through contributing factors and how this has been going on for quite a while and related to some of her automatic depressive/anxious/negative thoughts. She is to work more on identifying and interrupting those thoughts and replacing them with more positive, reality-based, and empowering thoughts and self-talk that do not support depression/anxiety/negativity.  Goal review and progress noted with patient.  Next appt within 2 weeks.   Mathis Fare, LCSW

## 2020-02-14 ENCOUNTER — Ambulatory Visit (INDEPENDENT_AMBULATORY_CARE_PROVIDER_SITE_OTHER): Admitting: Psychiatry

## 2020-02-14 ENCOUNTER — Other Ambulatory Visit: Payer: Self-pay

## 2020-02-14 DIAGNOSIS — F411 Generalized anxiety disorder: Secondary | ICD-10-CM | POA: Diagnosis not present

## 2020-02-14 NOTE — Progress Notes (Signed)
      [facility] Counselor/Therapist Progress Note  Patient ID: Trenda Corliss, MRN: [id number],    Date: 02/14/2020  Time Spent: 60 minutes  1:00pm to 2:00pm  Treatment Type: Individual Therapy  Reported Symptoms: anxiety (decreased) , negativity (improving)  Mental Status Exam:  Appearance:   Casual     Behavior:  Appropriate and Sharing  Motor:  Normal  Speech/Language:   Normal Rate  Affect:  anxious, some depression  Mood:  anxious and depressed  Thought process:  goal directed  Thought content:    WNL  Sensory/Perceptual disturbances:    WNL  Orientation:  oriented to person, place, time/date, situation, day of week, month of year and year  Attention:  Good  Concentration:  Good  Memory:  patient reports"mild forgetfulness"  Fund of knowledge:   Good  Insight:    Good  Judgment:   Good  Impulse Control:  Good and Fair   Risk Assessment: Danger to Self:  No Self-injurious Behavior: No Danger to Others: No Duty to Warn:no Physical Aggression / Violence:No  Access to Firearms a concern: No  Gang Involvement:No   Subjective: Patient today reporting anxiety, depression, and negativity and is seeing improvement in all of these symptoms.  Interventions: Cognitive Behavioral Therapy, Solution-Oriented/Positive Psychology and Ego-Supportive  Diagnosis:   ICD-10-CM   1. Generalized anxiety disorder  F41.1     Plan: Patient not signing Tx Plan on computer screen due to Covid.  Treatment Goals: Goals remain on Tx Plan as patient works with strategies to achieve her goals. Progress will be documented each session under "Progress" section of Plan.  Long Term goal: Develop healthy cognitive patterns and beliefs about self and the world that lead to alleviation and help to prevent relapse of depression.  Short Term goal: Identify and replace depressive thinking that leads to depressive feelings and actions.  Strategy: Educate the patient about cognitive  restructuring including self-monitoring of automatic thoughts reflecting depressive and anxious beliefs.   Progress: Patient showing progress in multiple areas. Trying to be more positive, vacation went well. Not quite as anxious nor depressed.  Trying to have more of a routine and helps me be more goal-oriented and motivated but still struggling some. Procrastination still an issue in not following through on plans more consistently. Is hard on herself and is working to be more self-accepting and positive self-talk.  Is having some success with letting go of her perfectionistic tendencies. Difficulty in communication with a sister and wanted to process this today as it adds to her anxiety and depression.  Looked at some changes she can make in her communication that could really make a difference in their relationship. Patient is motivated to make changes in this area with her family and I shared a couple new approaches that may be helpful to her as she reaches out to family. Continuing to work with her automatic anxious/depressive/negative thoughts, intercepting them and replacing with healthier, reality-based thoughts that are more empowering.   Goal review and progress noted with patient.  Next appt within 2 weeks.   Mathis Fare, LCSW

## 2020-02-28 ENCOUNTER — Ambulatory Visit (INDEPENDENT_AMBULATORY_CARE_PROVIDER_SITE_OTHER): Admitting: Psychiatry

## 2020-02-28 ENCOUNTER — Other Ambulatory Visit: Payer: Self-pay

## 2020-02-28 DIAGNOSIS — F411 Generalized anxiety disorder: Secondary | ICD-10-CM | POA: Diagnosis not present

## 2020-02-28 NOTE — Progress Notes (Signed)
      [facility] Counselor/Therapist Progress Note  Patient ID: Denise Liu, MRN: [id number],    Date: 02/28/2020  Time Spent: 60 minutes    11:00am to 12:00noon  Treatment Type: Individual Therapy  Reported Symptoms: anxiety, some impulsive eating/snacking, motivation sometimes wavers but "pretty good today", some depression but significant progress  Mental Status Exam:  Appearance:   Casual     Behavior:  Appropriate, Sharing and Motivated  Motor:  Normal  Speech/Language:   Normal Rate  Affect:  anxious  Mood:  anxious  Thought process:  goal directed  Thought content:    WNL  Sensory/Perceptual disturbances:    WNL  Orientation:  oriented to person, place, time/date, situation, day of week, month of year and year  Attention:  Good  Concentration:  Good and Fair  Memory:  WNL  Fund of knowledge:   Good  Insight:    Good  Judgment:   Good  Impulse Control:  Good and Fair   Risk Assessment: Danger to Self:  No Self-injurious Behavior: No Danger to Others: No Duty to Warn:no Physical Aggression / Violence:No  Access to Firearms a concern: No  Gang Involvement:No   Subjective: Patient today reporting anxiety.  Working to curb some impulsiveness in eating/snacking to improve health.  Also working to improve her self-esteem, improve her tolerance in doing tasks that she does not like but are necessary, and to improve her overall motivation aiming for more consistency.   Interventions: Cognitive Behavioral Therapy and Solution-Oriented/Positive Psychology  Diagnosis:   ICD-10-CM   1. Generalized anxiety disorder  F41.1     Plan: Patient not signing Tx Plan on computer screen due to Covid.  Treatment Goals: Goals remain on Tx Plan as patient works with strategies to achieve her goals. Progress will be documented each session under "Progress" section of Plan.  Long Term goal: Develop healthy cognitive patterns and beliefs about self and the world that lead  to alleviation and help to prevent relapse of depression.  Short Term goal: Identify and replace depressive thinking that leads to depressive feelings and actions.  Strategy: Educate the patient about cognitive restructuring including self-monitoring of automatic thoughts reflecting depressive and anxious beliefs.   Progress: Patient in today and is making some progress with her anxiety. Has begun to try and better manage some impulsive eating/snacking, and reports that drinking more water has helped her some but still difficult to be motivated especially if it's not something I enjoy. Worked on some self-coaching and motivating self to do things she is not typically interested in, but knows would help her, and her ability to see "the good things about me rather than just the bad." Working on being able to tolerate some activities that aren't of interest to her and worked with real-life examples for patient which helped.  Is showing some improved efforts in making changes and she is sensing some positive results. This is helping her with short term goal above of identifying and replacing anxious/depressive thought patterns with more self-accepting and positive thoughts, and to continue working on this between sessions. Also to continue work on some family relationships as discussed in prior sessions.  Depression has significantly decreased.  Goal review and progress/efforts noted with patient.  Next appt within 2 weeks.   Mathis Fare, LCSW

## 2020-03-13 ENCOUNTER — Ambulatory Visit (INDEPENDENT_AMBULATORY_CARE_PROVIDER_SITE_OTHER): Admitting: Psychiatry

## 2020-03-13 ENCOUNTER — Other Ambulatory Visit: Payer: Self-pay

## 2020-03-13 DIAGNOSIS — F411 Generalized anxiety disorder: Secondary | ICD-10-CM | POA: Diagnosis not present

## 2020-03-13 NOTE — Progress Notes (Signed)
[facility] Counselor/Therapist Progress Note  Patient ID: Denise Liu, MRN: [id number],    Date: 03/13/2020  Time Spent: 60 mintues   3:00pm to 4:00pm  Treatment Type: Individual Therapy  Reported Symptoms: anxiety, some improvement in motivation, self-negating  Mental Status Exam:  Appearance:   Casual     Behavior:  Appropriate, Sharing and Motivated  Motor:  Normal  Speech/Language:   Clear and Coherent  Affect:  anxiety  Mood:  anxious  Thought process:  normal  Thought content:    WNL  Sensory/Perceptual disturbances:    WNL  Orientation:  oriented to person, place, time/date, situation, day of week, month of year and year  Attention:  Fair  Concentration:  Fair  Memory:  WNL  Fund of knowledge:   Good  Insight:    Good and Fair  Judgment:   Good  Impulse Control:  Good   Risk Assessment: Danger to Self:  No Self-injurious Behavior: No Danger to Others: No Duty to Warn:no Physical Aggression / Violence:No  Access to Firearms a concern: No  Gang Involvement:No   Subjective: Patient today reporting anxiety and some improved motivation.  Interventions: Cognitive Behavioral Therapy and Solution-Oriented/Positive Psychology  Diagnosis:   ICD-10-CM   1. Generalized anxiety disorder  F41.1      Plan: Patient not signing Tx Plan on computer screen due to Covid.  Treatment Goals: Goals remain on Tx Plan as patient works with strategies to achieve her goals. Progress will be documented each session under "Progress" section of Plan.  Long Term goal: Develop healthy cognitive patterns and beliefs about self and the world that lead to alleviation and help to prevent relapse of depression.  Short Term goal: Identify and replace depressive thinking that leads to depressive feelings and actions.  Strategy: Educate the patient about cognitive restructuring including self-monitoring of automatic thoughts reflecting depressive and anxious beliefs.    Progress: Patient in today reporting some improvement in her motivation.  Still having anxiety, but not as strong.  States "I don't feel centered, have several areas to work on, but don't get much done, but am some more motivated."  Feels that if she were centered she would focus more on things internally versus external. Has felt centered previously, most recently about 3 yrs ago. States" the busier I am, often, the more centered I feel, but have to be careful to balance things so I don't get overwhelmed." Tends to be all-or-nothing, often jumps head-first into things and but doesn't seek balance, especially in activities and volunteer work. Feels it would help mood to get back involved in activities or outreach ministry but limit it initially because of tendency to not set limits. Also worked hard today on her negative self-perceptions and perfectionism "if I can't do it perfect, why do it at all" and I'm hard on myself so I don't get to feeling I'm better than others" even though she's not ever felt that way.  "I'm an extroverted introvert, trying to look like I fit in." Discussed her taking some steps to become less rigid with herself and give herself some space to grow and change in amore self-loving, self-respectful ways.  Is to follow up on finding some volunteer work that she'd like to get involved in and get information so she can make a decision, and to begin changing her self-talk and self-perceptions to being more positive and practice interrupting/changing.Patient to review the notes she takes in sessions.  Goal review and progress noted with  patient.  Next appt within 2 weeks.   Shanon Ace, LCSW

## 2020-03-20 ENCOUNTER — Other Ambulatory Visit: Payer: Self-pay

## 2020-03-20 ENCOUNTER — Encounter: Payer: Self-pay | Admitting: Physician Assistant

## 2020-03-20 ENCOUNTER — Ambulatory Visit (INDEPENDENT_AMBULATORY_CARE_PROVIDER_SITE_OTHER): Admitting: Physician Assistant

## 2020-03-20 DIAGNOSIS — F411 Generalized anxiety disorder: Secondary | ICD-10-CM

## 2020-03-20 DIAGNOSIS — F3342 Major depressive disorder, recurrent, in full remission: Secondary | ICD-10-CM | POA: Diagnosis not present

## 2020-03-20 NOTE — Progress Notes (Signed)
[facility] Med Check  Patient ID: Denise Liu,  MRN: [id number]  PCP: Windy Carina, PA-C  Date of Evaluation: 03/20/2020 Time spent:20 minutes  Chief Complaint:  Chief Complaint    Follow-up      HISTORY/CURRENT STATUS: HPI For routine f/u.  Doing really well! Feels good with the Zoloft.  She is able to enjoy things.  Energy and motivation are good.  She is not isolating.  Sleeps well most of the time, except she does stay up too late sometimes doing things that she wants to do and does not have time during the day.  Denies anxiety for the most part.  Denies suicidal or homicidal thoughts.  She is continuing to see Solon Augusta, LCSW which is really helping her a lot.  Patient denies increased energy with decreased need for sleep, no increased talkativeness, no racing thoughts, no impulsivity or risky behaviors, no increased spending, no increased libido, no grandiosity, no increased irritability or anger, and no hallucinations.  Denies dizziness, syncope, seizures, numbness, tingling, tremor, tics, unsteady gait, slurred speech, confusion. Denies muscle or joint pain, stiffness, or dystonia.  Individual Medical History/ Review of Systems: Changes? :Yes  cataract removed.    Past medications for mental health diagnoses include: Topamax for migraines,modafinil, Zoloft, gabapentin she did not like the way it made her feel, Wellbutrin XL  Allergies: Codeine, Wellbutrin [bupropion], and Topamax [topiramate]  Current Medications:  Current Outpatient Medications:  .  atorvastatin (LIPITOR) 10 MG tablet, Take 10 mg by mouth daily., Disp: , Rfl:  .  b complex vitamins capsule, Take 1 capsule by mouth daily., Disp: , Rfl:  .  calcium carbonate 200 MG capsule, Take 600 mg by mouth daily., Disp: , Rfl:  .  Cholecalciferol (VITAMIN D3) 2000 UNITS TABS, Take 4,000 Int'l Units by mouth., Disp: , Rfl:  .  co-enzyme Q-10 50 MG capsule, Take 300 mg by mouth daily., Disp: , Rfl:  .   cyclobenzaprine (FLEXERIL) 10 MG tablet, Take 10 mg by mouth 3 (three) times daily as needed for muscle spasms., Disp: , Rfl:  .  fluticasone (FLONASE) 50 MCG/ACT nasal spray, 1 spray by Each Nare route daily., Disp: , Rfl:  .  Glucosamine-Chondroit-Vit C-Mn (GLUCOSAMINE 1500 COMPLEX PO), Take by mouth., Disp: , Rfl:  .  levothyroxine (SYNTHROID) 25 MCG tablet, Take by mouth., Disp: , Rfl:  .  lidocaine (LIDODERM) 5 %, Place 1 patch onto the skin daily. Remove & Discard patch within 12 hours or as directed by MD, Disp: , Rfl:  .  losartan (COZAAR) 25 MG tablet, Take 25 mg by mouth daily., Disp: , Rfl:  .  Magnesium 250 MG TABS, Take by mouth., Disp: , Rfl:  .  MANGANESE PO, Take by mouth., Disp: , Rfl:  .  Misc Natural Products (TART CHERRY ADVANCED PO), Take by mouth., Disp: , Rfl:  .  Multiple Vitamins-Minerals (MULTIVITAMIN & MINERAL PO), Take by mouth., Disp: , Rfl:  .  naproxen sodium (ANAPROX) 220 MG tablet, Take 220 mg by mouth as needed., Disp: , Rfl:  .  Omega-3 Fatty Acids (FISH OIL) 1200 MG CAPS, Take 2,400 mg by mouth., Disp: , Rfl:  .  omeprazole (PRILOSEC) 20 MG capsule, TAKE 1 CAPSULE DAILY, Disp: , Rfl:  .  sertraline (ZOLOFT) 100 MG tablet, Take 100 mg by mouth daily. , Disp: , Rfl:  .  potassium chloride SA (KLOR-CON) 20 MEQ tablet, Take by mouth. (Patient not taking: Reported on 03/20/2020), Disp: , Rfl:  .  predniSONE (DELTASONE) 20 MG tablet, 2 tabs po daily x 3 days (Patient not taking: Reported on 03/08/2019), Disp: 6 tablet, Rfl: 0 .  traMADol (ULTRAM) 50 MG tablet, Take 50 mg by mouth every 6 (six) hours as needed for pain. (Patient not taking: Reported on 03/20/2020), Disp: , Rfl:  Medication Side Effects: none  Family Medical/ Social History: Changes? No  MENTAL HEALTH EXAM:  There were no vitals taken for this visit.There is no height or weight on file to calculate BMI.  General Appearance: Casual, Neat, Well Groomed and Obese  Eye Contact:  Good  Speech:  Clear and  Coherent and Normal Rate  Volume:  Normal  Mood:  Euthymic  Affect:  Appropriate  Thought Process:  Goal Directed and Descriptions of Associations: Intact  Orientation:  Full (Time, Place, and Person)  Thought Content: Logical   Suicidal Thoughts:  No  Homicidal Thoughts:  No  Memory:  WNL  Judgement:  Good  Insight:  Good  Psychomotor Activity:  Normal  Concentration:  Concentration: Good  Recall:  Good  Fund of Knowledge: Good  Language: Good  Assets:  Desire for Improvement  ADL's:  Intact  Cognition: WNL  Prognosis:  Good    DIAGNOSES:    ICD-10-CM   1. Generalized anxiety disorder  F41.1   2. Recurrent major depressive disorder, in full remission [facility])  F33.42     Receiving Psychotherapy: Yes Rockne Menghini, LCSW  RECOMMENDATIONS:  PDMP was reviewed. I provided 20 minutes of face-to-face time during this encounter. I am glad to see her doing so well! Continue Zoloft 100 mg, 1 p.o. daily. Continue therapy with Rockne Menghini, LCSW. Return in 6 months.  Melony Overly, PA-C

## 2020-03-27 ENCOUNTER — Ambulatory Visit (INDEPENDENT_AMBULATORY_CARE_PROVIDER_SITE_OTHER): Admitting: Psychiatry

## 2020-03-27 ENCOUNTER — Other Ambulatory Visit: Payer: Self-pay

## 2020-03-27 DIAGNOSIS — F411 Generalized anxiety disorder: Secondary | ICD-10-CM

## 2020-03-27 NOTE — Progress Notes (Signed)
      [facility] Counselor/Therapist Progress Note  Patient ID: Denise Liu, MRN: [id number],    Date: 03/27/2020  Time Spent: 60 minutes  3:00pm to 4:00pm  Treatment Type: Individual Therapy  Reported Symptoms: anxiety, depression, low self-esteem, low motivation  Mental Status Exam:  Appearance:   Casual     Behavior:  Appropriate and Sharing  Motor:  Normal  Speech/Language:   Normal Rate  Affect:  anxious, depressed  Mood:  anxious and depressed  Thought process:  normal  Thought content:    WNL  Sensory/Perceptual disturbances:    WNL  Orientation:  oriented to person, place, time/date, situation, day of week, month of year and year  Attention:  Good  Concentration:  Good  Memory:  some short term forgetfulness but "not significant"  Fund of knowledge:   Good  Insight:    Good  Judgment:   Good  Impulse Control:  Good   Risk Assessment: Danger to Self:  No Self-injurious Behavior: No Danger to Others: No Duty to Warn:no Physical Aggression / Violence:No  Access to Firearms a concern: No  Gang Involvement:No   Subjective: Patient today reporting anxiety, depression, low motivation, and low self-esteem.   Interventions: Cognitive Behavioral Therapy and Solution-Oriented/Positive Psychology  Diagnosis:   ICD-10-CM   1. Generalized anxiety disorder  F41.1     Plan: Patient not signing Tx Plan on computer screen due to Covid.  Treatment Goals: Goals remain on Tx Plan as patient works with strategies to achieve her goals. Progress will be documented each session under "Progress" section of Plan.  Long Term goal: Develop healthy cognitive patterns and beliefs about self and the world that lead to alleviation and help to prevent relapse of depression.  Short Term goal: Identify and replace depressive thinking that leads to depressive feelings and actions.  Strategy: Educate the patient about cognitive restructuring including self-monitoring of  automatic thoughts reflecting depressive and anxious beliefs.   Progress: Patient in today with anxiety, depression, low self esteem, and low motivation. Did see daughter in [place] and that helped her mood. Shares that she followed through on part of her homework and processed how that went for her, and did set appropriate limits rather than over-committing. On 1-10 scale for motivation, she rates herself a "3".  On 1-10 scale for self-esteem she rates herself a "5".  Processed her concerns today about her difficulty motivating herself and her low motivation. Feels if she could be more motivated, she would have better self-esteem.  Worked on some behavior changes with patient, as she made notes to have in between sessions.1. She is wanting to go to bed earlier and wake up earlier and is determined to working on this in coming weeks. 2. Wants to be proactive and tackling tasks as they arise rather than letting her "to do" list just keep rising. States "even with a few of the things I've tried differently since we last met, I do feel a little more centered". (as she expressed last session that she did not feel centered." Trying to stop being so "all or nothing" and not jumping ahead and worrying what will happen."  Goal review and progress noted with patient.  Next appt within 3 weeks.   Shanon Ace, LCSW

## 2020-04-10 ENCOUNTER — Ambulatory Visit (INDEPENDENT_AMBULATORY_CARE_PROVIDER_SITE_OTHER): Admitting: Psychiatry

## 2020-04-10 ENCOUNTER — Other Ambulatory Visit: Payer: Self-pay

## 2020-04-10 DIAGNOSIS — F411 Generalized anxiety disorder: Secondary | ICD-10-CM

## 2020-04-10 NOTE — Progress Notes (Signed)
[facility] Counselor/Therapist Progress Note  Patient ID: Denise Liu, MRN: [id number],    Date: 04/10/2020  Time Spent: 60 minutes  1:00pm to 2:00pm  Treatment Type: Individual Therapy  Reported Symptoms: anxiety (some better), motivation is better but still needs improvment  Mental Status Exam:  Appearance:   Well Groomed     Behavior:  Appropriate, Sharing and Motivated  Motor:  Normal  Speech/Language:   Clear and Coherent  Affect:  anxious  Mood:  anxious  Thought process:  goal directed  Thought content:    WNL  Sensory/Perceptual disturbances:    WNL  Orientation:  oriented to person, place, time/date, situation, day of week, month of year and year  Attention:  Fair  Concentration:  Fair  Memory:  "minor forgetfulness"  Fund of knowledge:   Good  Insight:    Good  Judgment:   Good  Impulse Control:  Good and Fair   Risk Assessment: Danger to Self:  No Self-injurious Behavior: No Danger to Others: No Duty to Warn:no Physical Aggression / Violence:No  Access to Firearms a concern: No  Gang Involvement:No   Subjective:  Patient today reports anxiety and still some difficulty with motivation.  Motivation has shown a little improvement and anxiety has improved some.    Interventions: Solution-Oriented/Positive Psychology and Ego-Supportive  Diagnosis:   ICD-10-CM   1. Generalized anxiety disorder  F41.1      Plan: Patient not signing Tx Plan on computer screen due to Covid.  Treatment Goals: Goals remain on Tx Plan as patient works with strategies to achieve her goals. Progress will be documented each session under "Progress" section of Plan.  Long Term goal: Develop healthy cognitive patterns and beliefs about self and the world that lead to alleviation and help to prevent relapse of depression or anxiety.  Short Term goal: Identify and replace depressive/anxious thinking that leads to depressive/anxious feelings and actions.Elevate her  motivation and self-esteem.  Strategy: Educate the patient about cognitive restructuring including self-monitoring of automatic thoughts reflecting depressive and anxious beliefs.   Progress: Patient today reports some decrease in anxiety and some bit of increase in motivation. Hoping to continue increasing her motivation and further decrease her anxiety. Followed up on homework from last session, patient shared she has worked on several areas and noticed goal-directed improvement. 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 Depression has lessened some. Self-esteem some better than last couple appts. I feel like I can breathe through my anxiety and am keeping it more controlled.  Struggles with body image, weight, some "wrinkles". "I try to approach this by looking at what I can or cannot change. Talked through a lot of her anxious thoughts.  She is to focus more on positives versus negatives and to work on stopping the self judgment.  On a 1-10 scale for motivation she rates herself now a 7 and previously was a 3, and we noted improvement.  On a 1-10 scale for self-esteem she now rates herself a 6 and was a 5 previously, another area of improvement.  Patient has continued to work on eliminating her habit of being "all or nothing" and not jumping ahead which increases her worries. Encouraged good self-care, emotional and physical and for some of the changes that she mentioned above regarding body image and weight, she is to explore healthy options and changes that she could make.  We will discuss this more  next session.  Goal review and progress noted with patient.  Next appt within 2-3 weeks.   Mathis Fare, LCSW

## 2020-04-24 ENCOUNTER — Ambulatory Visit (INDEPENDENT_AMBULATORY_CARE_PROVIDER_SITE_OTHER): Admitting: Psychiatry

## 2020-04-24 ENCOUNTER — Other Ambulatory Visit: Payer: Self-pay

## 2020-04-24 DIAGNOSIS — F411 Generalized anxiety disorder: Secondary | ICD-10-CM | POA: Diagnosis not present

## 2020-04-24 NOTE — Progress Notes (Signed)
[facility] Counselor/Therapist Progress Note  Patient ID: Denise Liu, MRN: [id number],    Date: 04/24/2020  Time Spent: 60 minutes   1:00p to 2:00pm  Treatment Type: Individual Therapy  Reported Symptoms: anxiety, tired, depression  Mental Status Exam:  Appearance:   Casual     Behavior:  Appropriate, Sharing and Motivated  Motor:  Normal  Speech/Language:   Clear and Coherent  Affect:  anxious, depressed ("but better")  Mood:  anxious and depressed  Thought process:  normal  Thought content:    WNL  Sensory/Perceptual disturbances:    WNL  Orientation:  oriented to person, place, time/date, situation, day of week, month of year and year  Attention:  Fair  Concentration:  Fair  Memory:  WNL  Fund of knowledge:   Good  Insight:    Good  Judgment:   Good  Impulse Control:  Good   Risk Assessment: Danger to Self:  No Self-injurious Behavior: No Danger to Others: No Duty to Warn:no Physical Aggression / Violence:No  Access to Firearms a concern: No  Gang Involvement:No   Subjective: Patient today reports "more good days than bad days".  Trying to think more positive but still tired a lot, which she checked with her PCP on and no problems found.   Interventions: Cognitive Behavioral Therapy and Solution-Oriented/Positive Psychology  Diagnosis:   ICD-10-CM   1. Generalized anxiety disorder  F41.1      Plan: Patient not signing Tx Plan on computer screen due to Covid.  Treatment Goals: Goals remain on Tx Plan as patient works with strategies to achieve her goals. Progress will be documented each session under "Progress" section of Plan.  Long Term goal: Develop healthy cognitive patterns and beliefs about self and the world that lead to alleviation and help to prevent relapse of depression or anxiety.  Short Term goal: Identify and replace depressive/anxious thinking that leads to depressive/anxious feelings and actions.Elevate her motivation  and self-esteem.  Strategy: Educate the patient about cognitive restructuring including self-monitoring of automatic thoughts reflecting depressive and anxious beliefs.   Progress: Patient in today reporting anxiety and depression, both of which have improved some. Concerned about her husband's past heart issue, as he's had a recent flare-up and to see doctor this week. Patient actually got together with friends and they went out together recently and had a good time,  Covid has impacted their being able to get together and be more relaxed. Some increased motivation in getting tasks done.  Still involved in a couple of volunteer activities. "I feel other people judge me but I don't judge myself." Then shared several more negative feelings about herself, and how she assumes other people feel negatively about her.  Judgmental of self and we looked at how a lot of these feelings began in earlier years with patient. She feels she is actually pulled away from some of the negative past but also sees some current self-negating.  Discussed this more and then agreed on some homework to do between sessions focusing on her lower self-esteem and how she would like to feel about herself more consistently, with decreased self judgment.  Encouraged patient to focus on improved self-care, emotionally and physically in keeping with her tx goal plan, and to follow-up on previous assignment about things she would like to change about herself in order to be healthier.  We will follow-up on this next session.  Goal review and progress/challenges noted with patient.  Next appointment within 3 weeks.  Mathis Fare, LCSW

## 2020-05-08 ENCOUNTER — Other Ambulatory Visit: Payer: Self-pay

## 2020-05-08 ENCOUNTER — Ambulatory Visit (INDEPENDENT_AMBULATORY_CARE_PROVIDER_SITE_OTHER): Admitting: Psychiatry

## 2020-05-08 DIAGNOSIS — F411 Generalized anxiety disorder: Secondary | ICD-10-CM

## 2020-05-08 NOTE — Progress Notes (Signed)
[facility] Counselor/Therapist Progress Note  Patient ID: Denise Liu, MRN: [id number],    Date: 05/08/2020  Time Spent: 60 minutes  4:00pm to 5:00pm  Treatment Type: Individual Therapy  Reported Symptoms: still struggling with motivation "but some better"; anxiety and depression have decreased some  Mental Status Exam:  Appearance:   Casual     Behavior:  Appropriate, Sharing and difficulty motivating herself but is showing progress  Motor:  Normal  Speech/Language:   Clear and Coherent  Affect:  anxious and depressed ("but improved")  Mood:  anxious and depressed  Thought process:  normal  Thought content:    WNL  Sensory/Perceptual disturbances:    WNL  Orientation:  oriented to person, place, time/date, situation, day of week, month of year and year  Attention:  Good  Concentration:  Fair  Memory:  some forgetfulness  Fund of knowledge:   Good  Insight:    Good and Fair  Judgment:   Good  Impulse Control:  Fair   Risk Assessment: Danger to Self:  No Self-injurious Behavior: No Danger to Others: No Duty to Warn:no Physical Aggression / Violence:No  Access to Firearms a concern: No  Gang Involvement:No   Subjective:  Patient today reports anxiety and depression have improved some. Some more positive about herself.  Interventions: Solution-Oriented/Positive Psychology and Ego-Supportive  Diagnosis:   ICD-10-CM   1. Generalized anxiety disorder  F41.1      Plan: Patient not signing Tx Plan on computer screen due to Covid.  Treatment Goals: Goals remain on Tx Plan as patient works with strategies to achieve her goals. Progress will be documented each session under "Progress" section of Plan.  Long Term goal: Develop healthy cognitive patterns and beliefs about self and the world that lead to alleviation and help to prevent relapse of depressionor anxiety.  Short Term goal: Identify and replace depressive/anxiousthinking that leads to  depressive/anxiousfeelings and actions.Elevate her motivation and self-esteem.  Strategy: Educate the patient about cognitive restructuring including self-monitoring of automatic thoughts reflecting depressive and anxious beliefs.   Progress: Patient in today sharing that her depression and anxiety have both decreased some. Has been more accepting of herself and working to let go of negative messages about herself.  "I' starting to realize I don't have to be perfect."  Picked up from last session, with patient sharing her list of changes she wants to make and she shares that she is feeling some more hopeful and feels that we are focusing on "the right things" for her.  Discussed her motivation, overthinking, getting caught up in things that aren't that important, and procrastination issues. From that, focused more on procrastination and overthinking which seemed helpful to her. Also suggested a book "Overcoming Overthinking" to patient. Discussed strategies to reduce her procrastination, especially acting more in the moment on doing things versus letting her self postpone action. This strategy seemed to be the one patient felt she would be more successful with so she plans to use this between sessions. To continue work on improving her her self-esteem and decreasig her negativity re: herself. Husband's heart issue reported last session is being taken care of as he is scheduled at hospital for procedure to correct his heart rhythm again. Encouraged positive self-care, emotional and physical per tx plan above which we reviewed today.  Goal review and progress/challenges noted with patient.  Next appt within 3 weeks.   Mathis Fare, LCSW

## 2020-05-22 ENCOUNTER — Ambulatory Visit (INDEPENDENT_AMBULATORY_CARE_PROVIDER_SITE_OTHER): Admitting: Psychiatry

## 2020-05-22 ENCOUNTER — Other Ambulatory Visit: Payer: Self-pay

## 2020-05-22 DIAGNOSIS — F411 Generalized anxiety disorder: Secondary | ICD-10-CM | POA: Diagnosis not present

## 2020-05-22 NOTE — Progress Notes (Addendum)
      [facility] Counselor/Therapist Progress Note  Patient ID: Denise Liu, MRN: [id number],    Date: 05/22/2020  Time Spent:  60 minutes   4:00pm to 5:00pm   Treatment Type: Individual Therapy  Reported Symptoms:  Low motivation, lack of purpose, "still working on improving self-esteem", "I get down on myself due to being overweight"  Mental Status Exam:  Appearance:   Casual     Behavior:  Appropriate and Sharing  Motor:  Normal  Speech/Language:   Clear and Coherent  Affect:  anxious  Mood:  anxious  Thought process:  goal directed  Thought content:    WNL  Sensory/Perceptual disturbances:    WNL  Orientation:  oriented to person, place, time/date, situation, day of week, month of year and year  Attention:  Good  Concentration:  Good and Fair  Memory:  WNL initially but patient later mentioned some stress related forgetting  Fund of knowledge:   Good  Insight:    Good and Fair  Judgment:   Good  Impulse Control:  Good   Risk Assessment: Danger to Self:  No Self-injurious Behavior: No Danger to Others: No Duty to Warn:no Physical Aggression / Violence:No  Access to Firearms a concern: No  Gang Involvement:No   Subjective:  Patient today reports some anxiety, low motivation, lack of purpose, working to improve self-esteem, and being "down" on myself.   Interventions: Solution-Oriented/Positive Psychology and Ego-Supportive  Diagnosis:   ICD-10-CM   1. Generalized anxiety disorder  F41.1     Plan: Patient not signing Tx Plan on computer screen due to Covid.  Treatment Goals: Goals remain on Tx Plan as patient works with strategies to achieve her goals. Progress will be documented each session under "Progress" section of Plan.  Long Term goal: Develop healthy cognitive patterns and beliefs about self and the world that lead to alleviation and help to prevent relapse of depressionor anxiety.  Short Term goal: Identify and replace  depressive/anxiousthinking that leads to depressive/anxiousfeelings and actions.Elevate her motivation and self-esteem.  Strategy: Educate the patient about cognitive restructuring including self-monitoring of automatic thoughts reflecting depressive and anxious beliefs.   Progress: Patient in today reporting some anxiety, occasional depression "but not really much now", feeling lack of purpose, low motivation, and frustrated "when other disagree with me." Working more diligently on accepting herself, letting go of self-negating, not expecting perfection in herself, trying to decrease procrastination and overthinking, what-if thinking,  picking thing apart, and self-judgement. Self-love is a struggle and she is to focus between sessions on loving and treating herself as she would someone else, and not comparing self to others. Worked with long term goal in session and she is to use that also in between sessions as patient works with above-stated issues about how she treats herself that is contributing to her symptoms.  Patient did seem more positive upon leaving session today.  Goal review and progress/challenges noted with patient.  Next appt within 3 weeks.   Mathis Fare, LCSW

## 2020-06-05 ENCOUNTER — Ambulatory Visit: Admitting: Psychiatry

## 2020-06-19 ENCOUNTER — Ambulatory Visit (INDEPENDENT_AMBULATORY_CARE_PROVIDER_SITE_OTHER): Admitting: Psychiatry

## 2020-06-19 ENCOUNTER — Other Ambulatory Visit: Payer: Self-pay

## 2020-06-19 DIAGNOSIS — F411 Generalized anxiety disorder: Secondary | ICD-10-CM

## 2020-06-19 NOTE — Progress Notes (Signed)
      [facility] Counselor/Therapist Progress Note  Patient ID: Denise Liu, MRN: [id number],    Date: 06/19/2020  Time Spent: 60 minutes   3:00pm to 4:00pm  Treatment Type: Individual Therapy  Reported Symptoms: anxiety "but some improvement", still struggling with motivation  Mental Status Exam:  Appearance:   Casual     Behavior:  Appropriate and Sharing  Motor:  Normal  Speech/Language:   Clear and Coherent  Affect:  anxious  Mood:  sometimes "irritable, up and down", anxious  Thought process:  goal directed  Thought content:    WNL  Sensory/Perceptual disturbances:    WNL  Orientation:  oriented to person, place, time/date, situation, day of week, month of year and year  Attention:  Good  Concentration:  Fair  Memory:  WNL  Fund of knowledge:   Good  Insight:    Good and Fair  Judgment:   Good and Fair  Impulse Control:  Fair   Risk Assessment: Danger to Self:  No Self-injurious Behavior: No Danger to Others: No Duty to Warn:no Physical Aggression / Violence:No  Access to Firearms a concern: No  Gang Involvement:No   Subjective:  Patient today reports anxiety some better, no depression, still having procrastination issues and a tough time following through on strategies to help that.   Interventions: Cognitive Behavioral Therapy and Solution-Oriented/Positive Psychology  Diagnosis:   ICD-10-CM   1. Generalized anxiety disorder  F41.1     Plan: Patient not signing Tx Plan on computer screen due to Covid.  Treatment Goals: Goals remain on Tx Plan as patient works with strategies to achieve her goals. Progress will be documented each session under "Progress" section of Plan.  Long Term goal: Develop healthy cognitive patterns and beliefs about self and the world that lead to alleviation and help to prevent relapse of depressionor anxiety.  Short Term goal: Identify and replace depressive/anxiousthinking that leads to  depressive/anxiousfeelings and actions.Elevate her motivation and self-esteem.  Strategy: Educate the patient about cognitive restructuring including self-monitoring of automatic thoughts reflecting depressive and anxious beliefs.   Progress: Patient in today reporting anxiety has decreased some. No depression. Still having some difficulty with motivation ad following through on suggested strategies. Using her Bible study material to help uplift and ground her, and also setting aside some time alone to be quiet "and just listen". Has spent more time with friends lately and we support each other. Encouraged patient to be more self-affirming rather than punitive in her attitude and self-talk. Not feeling quite as much "lack of purpose" and is getting better and looking for opportunities herself rather than waiting to be asked. Emphasis on things not having to be perfect, "so you continue to enjoy the process versus adding stress to it." Picked up on some of our work last session and patient has made some gains in feeling more positive about myself, decreasing her overthinking ("but I have a ways to go"), looking for the positives more than negatives, less self-negating, decreased what-if thinking, decrease picking things apart, and realized how some of those habits have been dragging her down. To continue work on these behaviors between now and next session.   Goal review and progress/challenges noted with patient.  Next appt within 3 weeks.   Mathis Fare, LCSW

## 2020-07-03 ENCOUNTER — Other Ambulatory Visit: Payer: Self-pay

## 2020-07-03 ENCOUNTER — Ambulatory Visit (INDEPENDENT_AMBULATORY_CARE_PROVIDER_SITE_OTHER): Admitting: Psychiatry

## 2020-07-03 DIAGNOSIS — F411 Generalized anxiety disorder: Secondary | ICD-10-CM | POA: Diagnosis not present

## 2020-07-03 NOTE — Progress Notes (Signed)
[facility] Counselor/Therapist Progress Note  Patient ID: Denise Liu, MRN: [id number],    Date: 07/03/2020  Time Spent: 60 minutes  4:00pm to 5:00pm   Treatment Type: Individual Therapy  Reported Symptoms:  anxiety (improved), had a really bad depressed week last week for about 4-5 days and is getting better, not sure what caused it nor what has helped it get better.  Does feel less depressed now and reports her depression" currently being a 2 or 3 on a 1-10 point scale so definitely better".   Mental Status Exam:  Appearance:   Casual     Behavior:  Appropriate and Sharing  Motor:  Normal  Speech/Language:   Clear and Coherent  Affect:  some depression, some anxiety, frustration  Mood:  anxious and depressed  Thought process:  goal directed  Thought content:    WNL  Sensory/Perceptual disturbances:    WNL  Orientation:  oriented to person, place, time/date, situation, day of week, month of year and year  Attention:  Good  Concentration:  Fair  Memory:  WNL  Fund of knowledge:   Good  Insight:    Good  Judgment:   Good  Impulse Control:  Good   Risk Assessment: Danger to Self:  No Self-injurious Behavior: No Danger to Others: No Duty to Warn:no Physical Aggression / Violence:No  Access to Firearms a concern: No  Gang Involvement:No   Subjective: Patient today reports anxiety some improved.  Had unexpected depressed 4-5 days last week but pulling out of it. " Tried to be more in touch with her feelings and emotions."   Interventions: Cognitive Behavioral Therapy and Solution-Oriented/Positive Psychology  Diagnosis:   ICD-10-CM   1. Generalized anxiety disorder  F41.1      Plan: Patient not signing Tx Plan on computer screen due to Covid.  Treatment Goals: Goals remain on Tx Plan as patient works with strategies to achieve her goals. Progress will be documented each session under "Progress" section of Plan.  Long Term goal: Develop healthy  cognitive patterns and beliefs about self and the world that lead to alleviation and help to prevent relapse of depressionor anxiety.  Short Term goal: Identify and replace depressive/anxiousthinking that leads to depressive/anxiousfeelings and actions.Elevate her motivation and self-esteem.  Strategy: Educate the patient about cognitive restructuring including self-monitoring of automatic thoughts reflecting depressive and anxious beliefs.   Progress: Patient in today reporting improvement in her anxiety.  Had several depressive days has week but better and getting better now. Has been feeling badly re: my body image and being over weight. Wants to be more self-disciplined to make some changes for herself that would be healthier but also struggles with commitment and motivation.  Would like to be healthier, lose weight, and feel better. Talks through a lot of self-care concerns and acknowledges she has not been very health-conscious in decision-making and taking better care of herself, "things that would also help mood issues." Discussed options with her and she took notes that she felt would be helpful re: motivation and follow-through, goals, making changes and "sticking with it." Still using some Bible study material and her faith to help ground and support her. Also using quiet time to reflect "and just listen."  Patient really needed some encouragement today and we focused on that a lot during session.  She did seem to be feeling more empowered and hopeful of making some changes that would help her emotionally and physically.  Encouraged more self-affirmation rather than being  punitive with herself, and more positive self talk.  Emphasized that perfection is not the goal and that is more important she start somewhere.  Encouraged patient to be less self-negating, to look for more positives and negatives, to decrease her overthinking and what if thinking.  Goal review and progress/challenges  noted with patient.  Next appointment within 3 weeks.   Mathis Fare, LCSW

## 2020-07-17 ENCOUNTER — Other Ambulatory Visit: Payer: Self-pay

## 2020-07-17 ENCOUNTER — Ambulatory Visit (INDEPENDENT_AMBULATORY_CARE_PROVIDER_SITE_OTHER): Admitting: Psychiatry

## 2020-07-17 DIAGNOSIS — F411 Generalized anxiety disorder: Secondary | ICD-10-CM

## 2020-07-17 NOTE — Progress Notes (Signed)
[facility] Counselor/Therapist Progress Note  Patient ID: Denise Liu, MRN: [id number],    Date: 07/17/2020  Time Spent: 60 minutes   4:00pm to 5:00pm  Treatment Type: Individual Therapy  Reported Symptoms: anxiety, some depression (has decreased recently)   Mental Status Exam:  Appearance:   Casual     Behavior:  Appropriate, Sharing and Motivated  Motor:  Normal  Speech/Language:   Clear and Coherent  Affect:  anxious  Mood:  anxious and some depression  Thought process:  normal  Thought content:    WNL  Sensory/Perceptual disturbances:    WNL  Orientation:  oriented to person, place, time/date, situation, day of week, month of year and year  Attention:  Good  Concentration:  Good  Memory:  some forgetful and has mentioned this to her PCP  Fund of knowledge:   Good  Insight:    Good and Fair  Judgment:   Good  Impulse Control:  Good   Risk Assessment: Danger to Self:  No Self-injurious Behavior: No Danger to Others: No Duty to Warn:no Physical Aggression / Violence:No  Access to Firearms a concern: No  Gang Involvement:No   Subjective: Patient today reports anxiety and some depression (improving). States she has felt better most recently, which has helped her motivation to increase.    Interventions: Cognitive Behavioral Therapy and Solution-Oriented/Positive Psychology  Diagnosis:   ICD-10-CM   1. Generalized anxiety disorder  F41.1      Plan: Patient not signing Tx Plan on computer screen due to Covid.  Treatment Goals: Goals remain on Tx Plan as patient works with strategies to achieve her goals. Progress will be documented each session under "Progress" section of Plan.  Long Term goal: Develop healthy cognitive patterns and beliefs about self and the world that lead to alleviation and help to prevent relapse of depressionor anxiety.  Short Term goal: Identify and replace depressive/anxiousthinking that leads to  depressive/anxiousfeelings and actions.Elevate her motivation and self-esteem.  Strategy: Educate the patient about cognitive restructuring including self-monitoring of automatic thoughts reflecting depressive and anxious beliefs.   Progress: Patient in today reporting anxiety and depression has improved more recently. States she is applying more of the strategies we speak about in sessions. For example, when she's procrastinating doing things, she more recently has begun immediately getting up and doing the task she has been putting off from doing.  Realizing how much better she feels when she does this more and more. Anxiety has also improved as "I'm not letting things bother me as much, and using the breathing exercises with benefit."  Still struggles with body image and feels that her impulsiveness only plays out in regards to food. States "I don't want to have to change how I eat, but do want to change how I look." Discussed this in more detail today as well as strategies to help prevent negative body image, including her need to change some shopping habits and trying to not see "cutting back" or "cutting out" as "denying herself" but rather as a measure to become healthier which what she desires. Looked at how some of her anxious thinking is involved with the whole body image issue for patient---using an example of a related anxious thought, we challenged it and replaced it to be more reality-based, positive, and empowering thought that does not support anxiety.  To use this more between sessions along with other positive self-care including being more self discipline and working on change, following through on steps discussed  today in reference to making healthier choices, sticking with changes after making them, looking for more positives than negatives each day, refrain from being punitive with herself, being in contact with supportive people, practice more positive self talk, stop the self  negating, and continuing to work on decreasing her overthinking.  Goal review and progress/challenges noted with patient.  Next appt within 3 weeks.   Mathis Fare, LCSW

## 2020-07-31 ENCOUNTER — Ambulatory Visit (INDEPENDENT_AMBULATORY_CARE_PROVIDER_SITE_OTHER): Admitting: Psychiatry

## 2020-07-31 ENCOUNTER — Other Ambulatory Visit: Payer: Self-pay

## 2020-07-31 DIAGNOSIS — F411 Generalized anxiety disorder: Secondary | ICD-10-CM

## 2020-07-31 NOTE — Progress Notes (Signed)
[facility] Counselor/Therapist Progress Note  Patient ID: Denise Liu, MRN: [id number],    Date: 07/31/2020  Time Spent:  60 minutes   11:00am to 12:00noon  Treatment Type: Individual Therapy  Reported Symptoms: anxiety, depression, frustration  Mental Status Exam:  Appearance:   Casual     Behavior:  Appropriate and Sharing  Motor:  Normal  Speech/Language:   Clear and Coherent  Affect:  anxious, some depression  Mood:  anxious and depressed  Thought process:  goal directed  Thought content:    WNL  Sensory/Perceptual disturbances:    WNL  Orientation:  oriented to person, place, time/date, situation, day of week, month of year and year  Attention:  Good  Concentration:  Good and Fair  Memory:  WNL  Fund of knowledge:   Good  Insight:    Good  Judgment:   Good and Fair  Impulse Control:  Good   Risk Assessment: Danger to Self:  No Self-injurious Behavior: No Danger to Others: No Duty to Warn:no Physical Aggression / Violence:No  Access to Firearms a concern: No  Gang Involvement:No   Subjective: Patient today reporting anxiety,depression, frustration especially with holiday season.  Trying to be more motivated and is having some success breaking up procrastination by using strategy of "get up and do it" that we worked on last session.   Interventions: Cognitive Behavioral Therapy and Solution-Oriented/Positive Psychology  Diagnosis:   ICD-10-CM   1. Generalized anxiety disorder  F41.1     Plan: Patient not signing Tx Plan on computer screen due to Covid.  Treatment Goals: Goals remain on Tx Plan as patient works with strategies to achieve her goals. Progress will be documented each session under "Progress" section of Plan.  Long Term goal: Develop healthy cognitive patterns and beliefs about self and the world that lead to alleviation and help to prevent relapse of depressionor anxiety.  Short Term goal: Identify and replace  depressive/anxiousthinking that leads to depressive/anxiousfeelings and actions.Elevate her motivation and self-esteem.  Strategy: Educate the patient about cognitive restructuring including self-monitoring of automatic thoughts reflecting depressive and anxious beliefs.   Progress: Patient in today reporting anxiety, depression, and frustration and noticing some improvement.  Today needing to talk further about her anxiety and holidays and guilt re: sister (she is oldest, other 4 siblings live in [place].) Notices her tendency to try and "fit everything into unreasonable time limits. Admits she is a Copy. Gets stressed easily due to high demands on herself and tends to look out for what may go wrong versus right. Anxious about commitments she has in next couple weeks and "trying to fit everything in". Anxious thoughts increased more recently as she thinks about her upcoming trip and the holidays, and we discussed her being able to interrupt those thoughts more quickly and replace with more reality-based and positive thoughts.  Trying not to assume the negative and be looking for more positives each day. Also processing feelings and concerned for older sister with increased health issues and some paranoid thinking and behaviors, and patient is trying to be supportive of her, as sister was released from rehab recently. Trying to help sister and also balance her own life in order to take care of herself.  Is practicing some better motivation since last appt and reports better "initiation of action" at times when she puts things off that she needs to do."  Adds that she has followed through on some of our talk last session in practicing the  getting up and moving when she's stuck in procrastination, and she reports that has been helpful. "That encourages me as I feel better when I do get up and moving". Patient encouraged to continue the more proactive behaviors as that have proven to be helpful to  her motivation level. Also encouraged her focus on healthy self discipline in health choices, not being so punitive with herself, looking for positives more than negatives, maintaining supportive contacts, working to make self talk more positive, and continue working to lessen her overthinking.  Goal review and progress/challenges noted with patient.  Next appointment within 2- 3 weeks.    Mathis Fare, LCSW

## 2020-08-08 ENCOUNTER — Ambulatory Visit: Admitting: Psychiatry

## 2020-08-21 ENCOUNTER — Other Ambulatory Visit: Payer: Self-pay

## 2020-08-21 ENCOUNTER — Ambulatory Visit (INDEPENDENT_AMBULATORY_CARE_PROVIDER_SITE_OTHER): Admitting: Psychiatry

## 2020-08-21 DIAGNOSIS — F411 Generalized anxiety disorder: Secondary | ICD-10-CM | POA: Diagnosis not present

## 2020-08-21 NOTE — Progress Notes (Signed)
[facility] Counselor/Therapist Progress Note  Patient ID: Koni Kannan, MRN: [id number],    Date: 08/21/2020  Time Spent: 50 minutes   3:00pm to 3:50pm  Treatment Type: Individual Therapy  Reported Symptoms: anxiety (some better), low energy  Mental Status Exam:  Appearance:   Casual        Behavior:  Appropriate and Sharing  Motor:  Normal  Speech/Language:   Clear and Coherent  Affect:  anxious, "but better"  Mood:  anxious  Thought process:  goal directed  Thought content:    WNL  Sensory/Perceptual disturbances:    WNL  Orientation:  oriented to person, place, time/date, situation, day of week, month of year and year  Attention:  Good  Concentration:  Fair  Memory:  WNL  Fund of knowledge:   Good  Insight:    Good  Judgment:   Good and Fair  Impulse Control:  Good and Fair   Risk Assessment: Danger to Self:  No Self-injurious Behavior: No Danger to Others: No Duty to Warn:no Physical Aggression / Violence:No  Access to Firearms a concern: No  Gang Involvement:No   Subjective: Patient today reports anxiety, but decreasing.  Some happier with myself and decisions I'm making.   Interventions: Cognitive Behavioral Therapy and Solution-Oriented/Positive Psychology  Diagnosis:   ICD-10-CM   1. Generalized anxiety disorder  F41.1     Plan: Patient not signing Tx Plan on computer screen due to Covid.  Treatment Goals: Goals remain on Tx Plan as patient works with strategies to achieve her goals. Progress will be documented each session under "Progress" section of Plan.  Long Term goal: Develop healthy cognitive patterns and beliefs about self and the world that lead to alleviation and help to prevent relapse of depressionor anxiety.  Short Term goal: Identify and replace depressive/anxiousthinking that leads to depressive/anxiousfeelings and actions.Elevate her motivation and self-esteem.  Strategy: Educate the patient about cognitive  restructuring including self-monitoring of automatic thoughts reflecting depressive and anxious beliefs.   Progress: Patient in today reporting anxiety, but improving.  Also shares she is "happier with myself and some choices I'm making" including starting the medically-based [employer] program, hoping to lose weight in healthy manner. Continues to work on relationship with oldest sister, involving lots of guilt--(Patient explains further but not all info included in this note due to patient privacy needs). Discussed ways patient could be of some support of her older sister but yet not overly involved with sister. It is difficult situation and created some difficult history within the family. Patient has really struggled with trying to make decisions to help sister and yet not take over everything as she does not want that responsibility by herself.  Discussed several options and patient seemed to feel better by session end.  She took notes during session and is to give all of this more thought, "make out some notes and get my thoughts more organized", and then talk with her sister about some choices.  Overall, patient's anxiety some better but she admits she is a perfectionist and tries to fit everything into unreasonable time limits, which adds to her stress.  Looked at ways of changing up that pattern of behavior and allowing more time for various tasks and being more tolerable when everything is not perfect.  Also tends to assume the worst or look for what might go wrong versus right, and states that she needs to work on this also.  Agrees to try practicing these changes some between sessions.  Patient reports she has continued to make some improvement and being more motivated and practicing the "initiation of action" at times instead of putting off things that she really needs to do.  Reports that her response time is sometimes not quite as rapid as she would like but is working on that.  Definitely does not  feel quite as stuck and procrastination.  Encourage patient to not be so punitive with herself, practice looking more for positives than negatives and not assuming the worst, working to make her self talk more positive, decrease her overthinking and overanalyzing, focus on her improved health choices, and continue to use more proactive behaviors that are helping her motivation level.   Goal review and progress/challenges noted with patient.  Next appointment within 3 weeks.  Mathis Fare, LCSW

## 2020-09-04 ENCOUNTER — Other Ambulatory Visit: Payer: Self-pay

## 2020-09-04 ENCOUNTER — Ambulatory Visit (INDEPENDENT_AMBULATORY_CARE_PROVIDER_SITE_OTHER): Admitting: Psychiatry

## 2020-09-04 DIAGNOSIS — F411 Generalized anxiety disorder: Secondary | ICD-10-CM | POA: Diagnosis not present

## 2020-09-04 NOTE — Progress Notes (Signed)
[facility] Counselor/Therapist Progress Note  Patient ID: Denise Liu, MRN: [id number],    Date: 09/04/2020  Time Spent: 50 minutes   1:00pm to 2:00pm   Treatment Type: Individual Therapy  Reported Symptoms:  Anxiety and depression (both improving)  Mental Status Exam:  Appearance:   Casual     Behavior:  Appropriate, Sharing and Motivated  Motor:  Normal  Speech/Language:   Clear and Coherent  Affect:  anxious  Mood:  anxious and depressed  Thought process:  goal directed  Thought content:    WNL  Sensory/Perceptual disturbances:    WNL  Orientation:  oriented to person, place, time/date, situation, day of week, month of year and year  Attention:  Good  Concentration:  Good/Fair  Memory:  WNL  Fund of knowledge:   Good  Insight:    Good  Judgment:   Good  Impulse Control:  Good and Fair   Risk Assessment: Danger to Self:  No Self-injurious Behavior: No Danger to Others: No Duty to Warn:no  Physical Aggression / Violence:No  Access to Firearms a concern: No  Gang Involvement:No   Subjective:  Patient today reporting anxiety and depression, and both are decreasing some.  Interventions: Cognitive Behavioral Therapy and Solution-Oriented/Positive Psychology  Diagnosis:   ICD-10-CM   1. Generalized anxiety disorder  F41.1      Plan: Patient not signing Tx Plan on computer screen due to Covid.  Treatment Goals: Goals remain on Tx Plan as patient works with strategies to achieve her goals. Progress will be documented each session under "Progress" section of Plan.  Long Term goal: Develop healthy cognitive patterns and beliefs about self and the world that lead to alleviation and help to prevent relapse of depressionor anxiety.  Short Term goal: Identify and replace depressive/anxiousthinking that leads to depressive/anxiousfeelings and actions.Elevate her motivation and self-esteem.  Strategy: Educate the patient about cognitive  restructuring including self-monitoring of automatic thoughts reflecting depressive and anxious beliefs.   Progress: Patient in today reporting anxiety and depression and they both have decreased some. Has begun a diet yesterday, program name [facility], "based on a medical diet". Still feeling happier with herself and choices she is making, including the "get up and do it" instead of procrastinating, being outside more and enjoying winter, and my attitude is getting more positive. Followup with older sister she discussed some last session, especially in relation to her guilt. Physical therapist suggested some balance exercises for her (x3 daily) after prior PT. overall patient's anxiety and depression continue to decrease some and patient is better at following through with suggested strategies, per short-term goal and strategy in treatment plan above which targets her anxiety and depression.  Still struggles with being a perfectionist but that is also improving.  Has made some behavior changes but is trying to become more persistent with those changes which is a good sign for her.  Patient reports she is catching herself in negative thought patterns of assuming the worst or looking for what might go wrong, and is getting some better at interrupting those and practicing looking for positives.  Has practiced the "initiation of action" exercise and/or task completion, with some benefit and states this is a work in progress.  Her progress is not quite as fast as she would like but she is learning to live with that and focus more on repetition of positive actions versus how quickly it happens.  Understands that the pressure to do things fast tends to come from  her perfectionism, but also may be from some of her anxiety as discussed in session today.  Not feeling stuck and does not feel she is procrastinating as much.  Continue to encourage patient to be understanding of herself and not punitive, to encourage her self  and look for positives versus negatives, keep herself talk more positive and encouraging, decrease overanalyzing and overthinking, practice being proactive versus reactive, and work to maintain her focus on improved health choices.  Goal review and progress/challenges noted with patient.  Next appt within 3 weeks.   Mathis Fare, LCSW

## 2020-09-17 ENCOUNTER — Ambulatory Visit: Admitting: Physician Assistant

## 2020-09-24 ENCOUNTER — Other Ambulatory Visit: Payer: Self-pay

## 2020-09-24 ENCOUNTER — Ambulatory Visit (INDEPENDENT_AMBULATORY_CARE_PROVIDER_SITE_OTHER): Admitting: Psychiatry

## 2020-09-24 DIAGNOSIS — F411 Generalized anxiety disorder: Secondary | ICD-10-CM
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
## 2020-10-08 ENCOUNTER — Ambulatory Visit: Admitting: Psychiatry

## 2020-10-22 ENCOUNTER — Other Ambulatory Visit: Payer: Self-pay

## 2020-10-22 ENCOUNTER — Ambulatory Visit (INDEPENDENT_AMBULATORY_CARE_PROVIDER_SITE_OTHER): Admitting: Psychiatry

## 2020-10-22 DIAGNOSIS — F411 Generalized anxiety disorder: Secondary | ICD-10-CM

## 2020-10-22 NOTE — Progress Notes (Signed)
[facility] Counselor/Therapist Progress Note  Patient ID: Denise Liu, MRN: [id number],    Date: 10/22/2020  Time Spent: 60 minutes    1:00pm to 2:00pm  Treatment Type: Individual Therapy  Reported Symptoms: depression (improved), anxiety, some lack of motivation   Mental Status Exam:  Appearance:   Neat     Behavior:  Appropriate, Sharing and Motivated  Motor:  Normal  Speech/Language:   Negative  Affect:  anxious  Mood:  anxious, depressed and some sadness and some irritability  Thought process:  normal  Thought content:    WNL  Sensory/Perceptual disturbances:    WNL  Orientation:  oriented to person, place, time/date, situation, day of week, month of year and year  Attention:  Good  Concentration:  Good and Fair  Memory:  short term memory issues with names and sometimes other things  Fund of knowledge:   Good  Insight:    Good  Judgment:   Good  Impulse Control:  Good   Risk Assessment: Danger to Self:  No Self-injurious Behavior: No Danger to Others: No Duty to Warn:no Physical Aggression / Violence:No  Access to Firearms a concern: No  Gang Involvement:No   Subjective: Patient today reporting anxiety and depression (improving.)  Also some irritability and sadness. Some lack of motivation at times.  Interventions: Solution-Oriented/Positive Psychology and Ego-Supportive  Diagnosis:   ICD-10-CM   1. Generalized anxiety disorder  F41.1     Plan: Patient not signing Tx Plan on computer screen due to Covid.  Treatment Goals: Goals remain on Tx Plan as patient works with strategies to achieve her goals. Progress will be documented each session under "Progress" section of Plan.  Long Term goal: Develop healthy cognitive patterns and beliefs about self and the world that lead to alleviation and help to prevent relapse of depressionor anxiety.  Short Term goal: Identify and replace depressive/anxiousthinking that leads to  depressive/anxiousfeelings and actions.Elevate her motivation and self-esteem.  Strategy: Educate the patient about cognitive restructuring including self-monitoring of automatic thoughts reflecting depressive and anxious beliefs.   Progress: Patient in today reporting anxiety and depression which she reports is decreasing some.  Irritability and sadness also recent symptoms. Lack of motivation and avoidance more recently but she states "she's not sure." Shares some of her writing that was her homework. Questions her more recent "avoidance, what do I want to do to accomplish, following up on goals". "I feel what I feel is right and I want others to feel the same way." Perfectionistic in some ways and challenging some of her ways of looking at things. Continues on her Optivia diet and has lost 17lbs. Feels good about that. Self-esteem "not good" and I hang onto negatives in the past. Processed some specific examples that are negative from her past and wanting to move forward "but not change everything from past that has become a part of who I am."  Interrupting negative thought patterns and feeling some more empowered at times.  At other times "I am not that motivated".  Hard for her to articulate more about the changes in her motivation.  She is to do some writing as homework for next session outlining parts of past she wants to let go of and parts she wants to hang on to. Perfectionism has lessened some more and is actually showing more personal growth and responsibility in what she is saying today as she processed more about her past and what she is wanting in the present and  into the future.  Encouraged patient to practice positive self talk, to intentionally look for more positives versus negatives daily, maintain her focus on improved health choices, practice proactivity versus reactivity,  work to further decrease her overanalyzing and overthinking, and remain in contact with people who are supportive  of her.   Goal review and progress/challenges noted with patient.  Next appointment within 3 weeks.    Mathis Fare, LCSW

## 2020-11-26 ENCOUNTER — Ambulatory Visit (INDEPENDENT_AMBULATORY_CARE_PROVIDER_SITE_OTHER): Admitting: Psychiatry

## 2020-11-26 ENCOUNTER — Other Ambulatory Visit: Payer: Self-pay

## 2020-11-26 DIAGNOSIS — F411 Generalized anxiety disorder: Secondary | ICD-10-CM | POA: Diagnosis not present

## 2020-11-26 NOTE — Progress Notes (Signed)
[facility] Counselor/Therapist Progress Note  Patient ID: Denise Liu, MRN: [id number],    Date: 11/26/2020  Time Spent: 60 minutes    1:00pm to 2:00pm  Treatment Type: Individual Therapy  Reported Symptoms: anxiety, sometime lethargic, depression  Mental Status Exam:  Appearance:   Casual     Behavior:  Appropriate, Sharing and Motivated  Motor:  Normal  Speech/Language:   Clear and Coherent  Affect:  anxious  Mood:  anxious and depressed  Thought process:  goal directed  Thought content:    WNL  Sensory/Perceptual disturbances:    WNL  Orientation:  oriented to person, place, time/date, situation, day of week, month of year and year  Attention:  Fair  Concentration:  Fair  Memory:  WNL  Fund of knowledge:   Good  Insight:    Fair  Judgment:   Good  Impulse Control:  Good   Risk Assessment: Danger to Self:  No Self-injurious Behavior: No Danger to Others: No Duty to Warn:no Physical Aggression / Violence:No  Access to Firearms a concern: No  Gang Involvement:No   Subjective: Patient today reports anxiety and some depression. Sometimes lethargic and overwhelmed (mostly re: family).   Interventions: Cognitive Behavioral Therapy and Solution-Oriented/Positive Psychology  Diagnosis:   ICD-10-CM   1. Generalized anxiety disorder  F41.1     Plan: Patient not signing Tx Plan on computer screen due to Covid.  Treatment Goals: Goals remain on Tx Plan as patient works with strategies to achieve her goals. Progress will be documented each session under "Progress" section of Plan.  Long Term goal: Develop healthy cognitive patterns and beliefs about self and the world that lead to alleviation and help to prevent relapse of depressionor anxiety.  Short Term goal: Identify and replace depressive/anxiousthinking that leads to depressive/anxiousfeelings and actions.Elevate her motivation and self-esteem.  Strategy: Educate the patient about  cognitive restructuring including self-monitoring of automatic thoughts reflecting depressive/negative, and anxious beliefs.   Progress: Patient in today reporting anxiety and some depression. Feels lethargic and overwhelmed at times with family circumstances and especially a sister right now who has been in rehab and now out and back at her home, which is not a good situation for her due to the choices her sister makes.  Patient working to trying to get resources to help sister, but also trying to have appropriate boundaries.  The boundaries part is hard for patient and we discussed what some appropriate boundaries might look like for her, including more time spent with good supportive friends, supporting but also setting limits with her sister and providing sister with resources that can help her, getting outside and walking, contacts with friends at church, letting go of any guilt, doing some journaling between sessions, and involving herself in activities that she enjoys.  Discussed with patient how some of these suggestions can help her feel more resilient, less anxious and less depressed, but also understanding that she is needing to have some boundaries in place with her sister due to sister's behavior and choices.  Continues to work, per strategy in tx goal plan above, on interrupting negative thought patterns and looking at what she can do about situations versus cannot do which is helping her as she works also on feeling more empowered to handle life situations.  Demonstrates less "perfectionism" which we spoke about last session and showing tolerance for "things not always being just right".  Patient states that due to her circumstances and what all she is dealing with  right now, she wants to come back in sooner than usual and plans to schedule within 2 weeks.  Encouraged her to remain in contact with supportive people, to continue decreasing her overthinking and overanalyzing, to intentionally look  for positives daily, continue positive health choices, practice positive self talk, practice proactivity versus reactivity, and realize the strength that she is showing and trying to make good decisions, set appropriate boundaries, and move forward in the midst of difficult circumstances.   Goal review and progress/challenges noted with patient.  Next appointment within 2 to 3 weeks.    Mathis Fare, LCSW

## 2020-12-12 ENCOUNTER — Ambulatory Visit (INDEPENDENT_AMBULATORY_CARE_PROVIDER_SITE_OTHER): Admitting: Psychiatry

## 2020-12-12 ENCOUNTER — Other Ambulatory Visit: Payer: Self-pay

## 2020-12-12 DIAGNOSIS — F411 Generalized anxiety disorder: Secondary | ICD-10-CM | POA: Diagnosis not present
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
## 2020-12-27 ENCOUNTER — Other Ambulatory Visit: Payer: Self-pay

## 2020-12-27 ENCOUNTER — Ambulatory Visit (INDEPENDENT_AMBULATORY_CARE_PROVIDER_SITE_OTHER): Admitting: Psychiatry

## 2020-12-27 DIAGNOSIS — F411 Generalized anxiety disorder: Secondary | ICD-10-CM

## 2020-12-27 NOTE — Progress Notes (Signed)
[facility] Counselor/Therapist Progress Note  Patient ID: Denise Liu, MRN: [id number],    Date: 12/27/2020  Time Spent: 60 minutes   12:00noon to 1:00pm  Treatment Type: Individual Therapy  Reported Symptoms: Anxiety, depression, low self-esteem  Mental Status Exam:  Appearance:   Casual     Behavior:  Appropriate and Sharing  Motor:  Normal  Speech/Language:   Clear and Coherent  Affect:  anxiety, depression, low motivation  Mood:  anxious and depressed  Thought process:  normal  Thought content:    WNL  Sensory/Perceptual disturbances:    WNL  Orientation:  oriented to person, place, time/date, situation, day of week, month of year and year  Attention:  Fair  Concentration:  Fair  Memory:  sometimes forgetting a word "but not a lot"  Fund of knowledge:   Good  Insight:    Good  Judgment:   Good  Impulse Control:  Good   Risk Assessment: Danger to Self:  No Self-injurious Behavior: No Danger to Others: No Duty to Warn:no Physical Aggression / Violence:No  Access to Firearms a concern: No  Gang Involvement:No   Subjective: Patient today reports anxiety and depression, and lower self-esteem. Is back on her weight reduction program.  See Progress Note below.   Interventions: Solution-Oriented/Positive Psychology and Insight-Oriented  Diagnosis:   ICD-10-CM   1. Generalized anxiety disorder  F41.1      Plan: Patient not signing Tx Plan on computer screen due to Covid.  Treatment Goals: Goals remain on Tx Plan as patient works with strategies to achieve her goals. Progress will be documented each session under "Progress" section of Plan.  Long Term goal: Develop healthy cognitive patterns and beliefs about self and the world that lead to alleviation and help to prevent relapse of depressionor anxiety.  Short Term goal: Identify and replace depressive/anxiousthinking that leads to depressive/anxiousfeelings and actions.Elevate her  motivation and self-esteem.  Strategy: Educate the patient about cognitive restructuring including self-monitoring of automatic thoughts reflecting depressive/negative,and anxious beliefs.   Progress: Patient in today reporting anxiety, depression, and lowered self-esteem related to personal, family, and specifically issues with her older sister. Motivation is some lower. Situation with sister and adult PS is very stressful and patient is main contact for sister. Has been more aware recently of struggling with things in her past, which she shared today expressing some sadness, some questioning, and some of this is aggravated by one of her sister's recent decline (as noted above). Looking at so many different issues within family and "it's like trying to put a puzzle together and the pieces aren't fitting nicely.  Patient able to process more of these thoughts/feelings as a lot of her anxious/depressive thoughts/feelings/memories.  (Not all details shared in this note due to patient privacy needs.)  Did seem to feel more grounded by session and and is to do some journaling as part of homework before next session.  Encouraged patient to stop self negating, to set and keep healthy boundaries with others as needed, to be more positive in her self talk and looking for positives more than negatives each day, healthy nutrition and exercise, getting outside daily, staying in touch with people who are supportive of her, continue work on letting go of past guilt, decrease her overthinking, interrupting anxious/negative thoughts and replacing with more realistic/positive thoughts that do not feed her anxiety and depression, being proactive versus reactive in situations, and to feel encouraged by the strength she is showing in challenging circumstances  to follow through on goal-directed behaviors as she works on moving forward.  Goal review and progress/challenges noted with patient.  Next appointment within 2 to  3 weeks.   Mathis Fare, LCSW

## 2021-01-10 ENCOUNTER — Ambulatory Visit (INDEPENDENT_AMBULATORY_CARE_PROVIDER_SITE_OTHER): Admitting: Psychiatry

## 2021-01-10 ENCOUNTER — Other Ambulatory Visit: Payer: Self-pay

## 2021-01-10 DIAGNOSIS — F411 Generalized anxiety disorder: Secondary | ICD-10-CM

## 2021-01-10 NOTE — Progress Notes (Signed)
[facility] Counselor/Therapist Progress Note  Patient ID: Denise Liu, MRN: [id number],    Date: 01/10/2021  Time Spent: 60 minutes   1:00pm to 2:00pm  Treatment Type: Individual Therapy  Reported Symptoms: anxiety and "I feel like my head is so full and I have trouble focusing on everything"  Mental Status Exam:  Appearance:   Casual and Neat     Behavior:  Appropriate and Sharing  Motor:  Normal  Speech/Language:   Clear and Coherent  Affect:  Depressed and anxious  Mood:  anxious and depressed  Thought process:  normal  Thought content:    WNL  Sensory/Perceptual disturbances:    WNL  Orientation:  oriented to person, place, time/date, situation, day of week, month of year and year  Attention:  Fair  Concentration:  Fair  Memory:  WNL  Fund of knowledge:   Good  Insight:    Fair  Judgment:   Good and Fair  Impulse Control:  Good and Fair   Risk Assessment: Danger to Self:  No Self-injurious Behavior: No Danger to Others: No Duty to Warn:no Physical Aggression / Violence:No  Access to Firearms a concern: No  Gang Involvement:No   Subjective:  Patient today reports depression as main symptom, and also some anxiety, lowered self-esteem all related to personal, family, and especially older sister. Some overwhelmedness trying to help her older sister who recently entered a nursing/rehab home. "Got off my diet and gained weight back, just feel like I lose control." "Mad at herself, a 5 on a 1-10 scale." Hard to get and stay motivated and self esteem is lowered. Lots of negative thinking and focusing on negative outcomes. Worked in session today with CBT and using her long and short-term goals and treatment plan below to work more on seeing some of the cognitive patterns and beliefs about herself that are not currently healthy and working to change them to be more self caring and healthy.  This should also help in increasing her motivation and elevating her  self-esteem.  Difficult for patient to work on initially but she became more interactive and seemed to realize benefit by the end of session.  She is to continue working with this as well as some of her automatic thoughts that we highlighted in session today, and work to interrupt those thoughts and change them to be more reality-based and affirming for patient.  Will assess again next visit.     Interventions: Solution-Oriented/Positive Psychology and Ego-Supportive  Diagnosis:   ICD-10-CM   1. Generalized anxiety disorder  F41.1     Plan: Patient not signing Tx Plan on computer screen due to Covid.  Treatment Goals: Goals remain on Tx Plan as patient works with strategies to achieve her goals. Progress will be documented each session under "Progress" section of Plan.  Long Term goal: Develop healthy cognitive patterns and beliefs about self and the world that lead to alleviation and help to prevent relapse of depressionor anxiety.  Short Term goal: Identify and replace depressive/anxiousthinking that leads to depressive/anxiousfeelings and actions.Elevate her motivation and self-esteem.  Strategy: Educate the patient about cognitive restructuring including self-monitoring of automatic thoughts reflecting depressive/negative,and anxious beliefs.   Plan and recommendations: Patient to continue working with the strategies we used in session today and noted above in the "subjective" section of this note, aimed at stopping and replacing anxious/negative thoughts and also improving her motivation and increasing her self-esteem.  Encouraged patient to set and keep healthy boundaries with  others, to stop self negating, to be more positive in her self talk and looking for more positives each day, healthy nutrition and exercise, letting go of past guilt, decrease her overthinking, get outside daily, intentionally staying in touch with other people who are supportive of her, practice  being more proactive versus reactive in situations, and recognize the strength she is showing in the midst of difficult circumstances as she tries to work with goal-directed behaviors in trying to move forward in a more positive direction.   Goal review and progress/challenges noted with patient.  Next appointment within 2 to 3 weeks.   Mathis Fare, LCSW

## 2021-01-24 ENCOUNTER — Ambulatory Visit (INDEPENDENT_AMBULATORY_CARE_PROVIDER_SITE_OTHER): Admitting: Psychiatry

## 2021-01-24 ENCOUNTER — Other Ambulatory Visit: Payer: Self-pay

## 2021-01-24 DIAGNOSIS — F411 Generalized anxiety disorder: Secondary | ICD-10-CM | POA: Diagnosis not present

## 2021-01-24 NOTE — Progress Notes (Signed)
[facility] Counselor/Therapist Progress Note  Patient ID: Denise Liu, MRN: [id number],    Date: 01/24/2021  Time Spent: 60 minutes   Treatment Type: Individual Therapy  Reported Symptoms: Anxiety, some depression  Mental Status Exam:  Appearance:   Casual and Neat     Behavior:  Appropriate, Sharing and Motivated  Motor:  Normal  Speech/Language:   Clear and Coherent  Affect:  anxious  Mood:  anxious  Thought process:  goal directed  Thought content:    obsessiveness  Sensory/Perceptual disturbances:    WNL  Orientation:  oriented to person, place, time/date, situation, day of week, month of year and year  Attention:  Fair  Concentration:  Fair  Memory:  some short term forgetting--Dr. is aware  Fund of knowledge:   Good  Insight:    Good and Fair  Judgment:   Good and Fair  Impulse Control:  Fair   Risk Assessment: Danger to Self:  No Self-injurious Behavior: No Danger to Others: No Duty to Warn:no Physical Aggression / Violence:No  Access to Firearms a concern: No  Gang Involvement:No   Subjective:  Patient in today reporting anxiety as her main symptom, with some depression also but less frequent.  Is having to deal with multiple stressors right now.  Still struggles with low self esteem and "it's related to my issues with anxiety." Had a "really bad experience with my anxiety while away on a church group event." Got very frustrated and spoke out several times when things didn't go as planned and got visibly upset several times.  Got support from others attending the event and that was helpful. Felt left out and didn't want to admit it but did process her thoughts and feelings and how she sees they are connected to "some of my past that I didn't realize was so present for me now."  Used some CBT and referred to her treatment goal plan and working with patient on these issues that are getting in the way of her feeling more comfortable socially.  Motivation did  seem to increase as the session continued.  Trying to manage the situation involving her older sister in nursing rehab center as sister's needs are changing. Not feeling as overwhelmed and is going on a weekend trip with husband this weekend and feels that will help her. Not as angry at herself today and rates  Herself as a "4" on 1-10 scale of anger at self, which represents a decrease for patient.  The session end, patient's affect was more full and smiled several times.  She also seemed more positive and less focused on "potential negative outcomes" and willing to take the step to change some of her behavior around other people that would be more positive and inviting rather than feeling "isolated".  Also by end of session her negative thinking seemed to be changed to more positive thinking.   Interventions: Solution-Oriented/Positive Psychology, Ego-Supportive and Insight-Oriented  Diagnosis:   ICD-10-CM   1. Generalized anxiety disorder  F41.1      Treatment Goal plan: Patient not signing Tx Plan on computer screen due to Covid. Treatment Goals: Goals remain on Tx Plan as patient works with strategies to achieve her goals. Progress will be documented each session under "Progress" section of Plan. Long Term goal: Develop healthy cognitive patterns and beliefs about self and the world that lead to alleviation and help to prevent relapse of depressionor anxiety. Short Term goal: Identify and replace depressive/anxiousthinking that leads to  depressive/anxiousfeelings and actions.Elevate her motivation and self-esteem. Strategy: Educate the patient about cognitive restructuring including self-monitoring of automatic thoughts reflecting depressive/negative,and anxious beliefs.     Progress / Plan:  Patient today presented initially with a more negative frame of mind but by the end of session was more positive, less self judgmental, and more willing to try some different behaviors to  get different results within her social setting.  She is to continue working on the issues we worked on in session today as noted above, which should help alleviate some of her previously used self-defeating thoughts and behaviors.  Worked with her on some self monitoring of her automatic thoughts that tend to be more anxious or negative and she responded well with that.  Encouraged her to keep practicing these the strategies in between sessions to have some continuity.  Also encouraged her to have healthy boundaries with others, to more consistently practice positive self talk and self-care, stop self negating, healthy nutrition and exercise, letting go of past guilt, practice being more proactive versus reactive in situations, intentionally look for positives each day, decrease her overthinking and overanalyzing, stay in contact with people who are supportive of her, get outside daily, and feel good about the strength she is showing as she tries to stick with goal-directed behaviors and helping her move forward in a more positive direction for better overall emotional health.   Goal review and progress/challenges noted with patient.  Next appointment within 2 to 3 weeks.   Mathis Fare, LCSW

## 2021-02-07 ENCOUNTER — Other Ambulatory Visit: Payer: Self-pay

## 2021-02-07 ENCOUNTER — Ambulatory Visit (INDEPENDENT_AMBULATORY_CARE_PROVIDER_SITE_OTHER): Admitting: Psychiatry

## 2021-02-07 DIAGNOSIS — F331 Major depressive disorder, recurrent, moderate: Secondary | ICD-10-CM | POA: Diagnosis not present

## 2021-02-07 NOTE — Progress Notes (Signed)
[facility] Counselor/Therapist Progress Note  Patient ID: Denise Liu, MRN: [id number],    Date: 02/07/2021  Time Spent: 60 minutes   Treatment Type: Individual Therapy  Reported Symptoms: anxiety, depression, lower motivation, denies any SI  Mental Status Exam:  Appearance:   Casual and Neat     Behavior:  Appropriate and Sharing  Motor:  Normal  Speech/Language:   Clear and Coherent  Affect:  Depressed and anxious  Mood:  anxious and depressed  Thought process:  normal  Thought content:    WNL  Sensory/Perceptual disturbances:    WNL  Orientation:  oriented to person, place, time/date, situation, day of week, month of year, and year  Attention:  Fair  Concentration:  Fair  Memory:  WNL  Fund of knowledge:   Good  Insight:    Good  Judgment:   Good  Impulse Control:  Good and Fair   Risk Assessment: Danger to Self:  No Self-injurious Behavior: No Danger to Others: No Duty to Warn:no Physical Aggression / Violence:No  Access to Firearms a concern: No  Gang Involvement:No   Subjective:  Patient in today reporting anxiety, depression, and less motivation. Is getting some better at being more self-affirming and taking compliments and needs to keep working on this.  Some elevation in self-esteem. Worked on this more today and how it's difficult to recognize her positives and that to her feels like she is Microbiologist. Discussed how it's ok to recognize when she does something well. Recognizing more in her relationships with others that "I'm making assumptions that aren't always true" but acts as if they're true. Discussed this more today, using specific examples, and able to see how some ways of thinking and feeling negative impact her communication with others.  Processed some ways with patient of transitioning away from negative thinking and feeling to more positive and affirming ways of thinking and feeling, which she recognizes may impact her communication with others  in a more positive ways.  Is having some difficulty and interactions with others and this has helped increase her motivation to work on some changes. Reviewed some of the work we did last session with CBT that she found helpful.  Still heavily involved with her sister's situation in a nursing rehab center which is stressful but patient feels that she definitely wants to continue to help until they can have a more stable plan in place.  Anger has decreased since last appointment from a "4" to a "2" on a 1-10 scale of anger.   Interventions: Ego-Supportive and Insight-Oriented  Diagnosis:   ICD-10-CM   1. Major depressive disorder, recurrent episode, moderate ([facility])  F33.1       Plan:  Patient today reports less motivation but ended up working quite well on some issues that were tough for her but she did not back away from them.  Some of the discomfort is issues she is having about how she feels about herself and also in relationships with others and some inferiority.  Motivation seemed to increase some the more we spoke, and she actually demonstrated less self negating and less self judging.  Encouraging her to continue working on automatic thoughts that are negative and judgmental, with which she agreed she needs to keep working.  Encouraged patient to more consistently practice positive self talk and positive self-care, healthy nutrition and exercise, letting go of past guilt, use healthy boundaries with others as needed, practice being more proactive versus reactive in situations, decrease  her overthinking and overanalyzing, intentionally look for positives each day, stay in contact with people who are supportive of her, get outside daily and walk, and recognize the strength she is showing as she works with goal-directed behaviors in trying to move forward in a more positive direction for improved emotional and physical health.  Review and progress/challenges noted with patient.  Next appointment  within 2 to 3 weeks.  Mathis Fare, LCSW

## 2021-02-27 ENCOUNTER — Ambulatory Visit: Admitting: Psychiatry

## 2021-02-27 ENCOUNTER — Other Ambulatory Visit: Payer: Self-pay

## 2021-02-27 DIAGNOSIS — F331 Major depressive disorder, recurrent, moderate: Secondary | ICD-10-CM

## 2021-02-27 NOTE — Progress Notes (Signed)
[facility] Counselor/Therapist Progress Note  Patient ID: Denise Liu, MRN: [id number],    Date: 02/27/2021  Time Spent: 60 minutes   Treatment Type: Individual Therapy  Reported Symptoms: anxiety, some lower motivation, depression  Mental Status Exam:  Appearance:   Casual and Neat     Behavior:  Appropriate  Motor:  Normal  Speech/Language:   Clear and Coherent  Affect:  Depressed and anxious  Mood:  anxious and depressed  Thought process:  normal  Thought content:    Some obsessiveness  Sensory/Perceptual disturbances:    WNL  Orientation:  oriented to person, place, time/date, situation, day of week, month of year, year, and stated date of February 27, 2021  Attention:  Good  Concentration:  Good  Memory:  WNL  Fund of knowledge:   Good  Insight:    Good and Fair  Judgment:   Good  Impulse Control:  Good   Risk Assessment: Danger to Self:  No Self-injurious Behavior: No Danger to Others: No Duty to Warn:no Physical Aggression / Violence:No  Access to Firearms a concern: No  Gang Involvement:No   Subjective:  Patient in today reporting anxiety, depression, and some lower motivation.  Remains on her Zoloft.  Depression seems to be the stronger symptom.  Finds that being with other people or making myself go out, does help the low motivation and depressed feelings. Feeling more depression recently and not sure why.  States there's not much interest/value in human life now referring to the issues of large numbers of immigrants entering the country. She has a passion for these people and is understandably concerned. Patient states her "down feelings" are more related to her "the world is becoming more of a bad place rather than a good place that I feel it should be." Vented some of her feelings and concerns about this in session today which seemed helpful to her as she states she doesn't talk about this with others.Also shared some of her current self-negating which  she realizes is not helping her mood. Still stressed with sisters's behavioral health issues and is likely to go from current facility to a group  home.  Processed her thoughts and feelings about sister's situation which has been quite difficult for patient.  Shared also an issue with her involvement in church that has happened recently.  Support offered to patient and help her see what she can control and what she can't, and also paying more attention to setting some limits and attending to her own needs.  She discussed what she needs and how to get those needs met. Encouraged patient to follow through on prioritizing and meeting some of her own needs, without feeling guilty. Patient more grounded and calm by session end, as well as better able to pinpoint her own needs and understand the importance of meeting them.  Will up on this next session as we ran out of time today.  Encouraged her to work with some CBT as we did last session, especially with the feelings she has towards herself and struggling with perfection.    Interventions: Cognitive Behavioral Therapy and Solution-Oriented/Positive Psychology  Diagnosis:   ICD-10-CM   1. Major depressive disorder, recurrent episode, moderate ([place])  F33.1       Plan:  Patient today does show some motivation although less than prior session.  However I noticed that his we worked more intently in session, her motivation increased some.  Especially important right now is her recognizing her  own needs more and getting those met as it is easy for her to get "lost in the shuffle" when she neglects her own needs.  She was able to pull back from some of her other concerns and clearly identify needs of hers that were not getting met and discussed how to get them met.  Still experiencing inferiority and relationships and more recently she has withdrawn some from others but still involved in some church activities which for her is good.  Still needing to follow up on  some issues regarding perfection and certain expectations. Not quite as self judgmental today but it is not totally gone either.  Encouraged patient to work on some behaviors that have proven to be helpful previously in between appointments including: Working with her automatic thoughts that are negative and self judgmental and changing them to be more self accepting, consistently practice positive self talk, healthy nutrition and exercise, using healthy boundaries with others as needed, letting go of past guilt, practice being more proactive versus reactive in situations, intentionally look for positives daily, stay in contact with people who are supportive, decrease her overthinking and overanalyzing, get outside daily and walk, and feel good about the strength she is showing as she works with goal-directed behaviors in trying to improve her emotional and physical health in order to move forward in a more positive direction.   Goal review and progress/challenges noted with patient.  Next appointment within 3 weeks.   Shanon Ace, LCSW

## 2021-03-13 ENCOUNTER — Other Ambulatory Visit: Payer: Self-pay

## 2021-03-13 ENCOUNTER — Ambulatory Visit: Admitting: Psychiatry

## 2021-03-13 DIAGNOSIS — F411 Generalized anxiety disorder: Secondary | ICD-10-CM

## 2021-03-13 NOTE — Progress Notes (Signed)
[facility] Counselor/Therapist Progress Note  Patient ID: Denise Liu, MRN: [id number],    Date: 03/13/2021  Time Spent: 58 minutes   Treatment Type: Individual Therapy  Reported Symptoms: Anxiety, depression (decreased), frustration  Mental Status Exam:  Appearance:   Casual and Neat     Behavior:  Appropriate, Sharing, and Motivated  Motor:  Normal  Speech/Language:   Clear and Coherent and Normal Rate  Affect:  Anxious, depressed,, frustrated  Mood:  anxious and depressed  Thought process:  goal directed  Thought content:    Some obsessiveness  Sensory/Perceptual disturbances:    WNL  Orientation:  oriented to person, place, time/date, situation, day of week, month of year, year, and stated date of March 13, 2021  Attention:  Fair  Concentration:  Fair  Memory:  Some forgetting and has mentioned to Dr  Huey Romans of knowledge:   Good  Insight:    Good and Fair  Judgment:   Good  Impulse Control:  Good   Risk Assessment: Danger to Self:  No Self-injurious Behavior: No Danger to Others: No Duty to Warn:no Physical Aggression / Violence:No  Access to Firearms a concern: No  Gang Involvement:No   Subjective:   Patient in today reporting anxiety, depression "is some better", still feels some lack of purpose at times although she's not feeling it quite as strong as last session. Has been very stressed with her sister trying to help her get a placement in a nursing facility and has recently gotten some leads that my be helpful. Really encouraged patient to take more breaks in her care of sister. Motivation"some better" and finds that music has become a help for her motivationally.  Doing morning devotions helps, as does staying in contact with friends. Feels she has accomplished more things the past couple weeks and also has followed through in working on "my control issues" and "these things have helped me feel like I'm making progress, which helps my mood."Acknowledges  that the time of year sometimes can influence her mood, as "I do better with my mood in Fall and Spring." Continues to limit her TV or online watching of disturbing world news and this helps her mentally/emotionally.  Worked in session more on identifying quickly her anxious thoughts so as to replace them with more reality based and encouraging thoughts.  She has made some progress with this but is hoping to increase this strategy in order to move forward more.  Also reviewed some of our prior discussion on her self negating, which has lessened some, and is a work in progress for patient.  Looking more at what she can control versus cannot and setting healthier limits for herself in order to consider her own needs and not just the needs of others with which she is helping.  Shared some of the ways that she is getting her needs met which is a real positive for patient, and especially that she is working to do this without any residual guilt.  Perfectionistic tendencies have toned down a bit and patient continues to work on this as she is getting better at interrupting the cycle.   Interventions: Cognitive Behavioral Therapy and Insight-Oriented  Diagnosis:   ICD-10-CM   1. Generalized anxiety disorder  F41.1       Treatment Goal plan: Patient not signing Tx Plan on computer screen due to Covid. Treatment Goals: Goals remain on Tx Plan as patient works with strategies to achieve her goals.  Progress will be documented  each session under "Progress" section of Plan.  Long Term goal: Develop healthy cognitive patterns and beliefs about self and the world that lead to alleviation and help to prevent relapse of depression or anxiety. Short Term goal: Identify and replace depressive/anxious thinking that leads to depressive/anxious feelings and actions.Elevate her motivation and self-esteem.  Strategy: Educate the patient about cognitive restructuring including self-monitoring of automatic thoughts  reflecting depressive/negative, and anxious beliefs.     Plan:   Patient today showing good motivation and a and that motivation has increased since prior session.  Recognizing her own needs more as she also helps care for others, especially her older sister.  Also working with some continued inferiority within relationships with some progress.  Becoming less self judgmental.  Encouraged patient to work on some behaviors that have been helpful to her previously between appointments including: Healthy nutrition and exercise, consistent positive self talk, working on her automatic thoughts that are negative and self judgmental and trying to change them to be more self accepting, using healthy boundaries with others as needed, letting go of past guilt, intentionally look for more positives daily, practice being more proactive versus reactive in situations, look also for positives within herself, stay in contact with people who are supportive, stay in the present focusing on what she can control, decrease overthinking and overanalyzing, get outside daily and walk, and recognize the strengths she shows as she is working with goal-directed behaviors in trying to move forward in a more positive direction of improved emotional and physical health.  Goal review and progress/challenges noted with patient.  Next appointment within 2 to 3 weeks.   Shanon Ace, LCSW

## 2021-03-27 ENCOUNTER — Ambulatory Visit: Admitting: Psychiatry

## 2021-04-10 ENCOUNTER — Ambulatory Visit: Admitting: Psychiatry

## 2021-04-10 ENCOUNTER — Other Ambulatory Visit: Payer: Self-pay

## 2021-04-10 DIAGNOSIS — F411 Generalized anxiety disorder: Secondary | ICD-10-CM

## 2021-04-10 NOTE — Progress Notes (Signed)
[facility] Counselor/Therapist Progress Note  Patient ID: Denise Liu, MRN: [id number],    Date: 04/10/2021  Time Spent: 57 minutes   Treatment Type: Individual Therapy  Reported Symptoms: anxious, frustration, negativity, some depression, overwhelmed in trying to help sister in assisted living experiencing memory issues  Mental Status Exam:  Appearance:   Casual and Neat     Behavior:  Appropriate, Sharing, and Motivated  Motor:  Normal  Speech/Language:   Clear and Coherent  Affect:  Anxious, some depression  Mood:  anxious, depressed, and frustration, negativity  Thought process:  goal directed  Thought content:    WNL  Sensory/Perceptual disturbances:    WNL  Orientation:  oriented to person, place, time/date, situation, day of week, month of year, year, and stated date of Aug. 10, 2022  Attention:  Good  Concentration:  Good  Memory:  WNL  Fund of knowledge:   Good  Insight:    Good  Judgment:   Good  Impulse Control:  Good and Fair   Risk Assessment: Danger to Self:  No Self-injurious Behavior: No Danger to Others: No Duty to Warn:no Physical Aggression / Violence:No  Access to Firearms a concern: No  Gang Involvement:No   Subjective:   Patient in today reporting anxiety, frustration, negativity, depression, overwhelmed in trying to help sister who's had to move into assisted living and is having some memory issues. Needed session today to process her overwhelmedness in getting her sister in assisted living and coping with many ups and downs in that situation.Vented her concerns well today and also her anxiety, frustration, tiredness, negativity, depressive feelings, and feelings of being overwhelmed.  Discussed better self-care in detail and patient responded well and actually stated that she realized she was losing herself in the midst of trying to take care of her sister.  Discussed some specific ways of taking care of herself and she was very actively  involved in this part of the conversation which was positive.  Also looking at some things that help her motivation only which she states music is 1 of those things in addition to morning devotions and staying in contact with friends.  Also working to let go of the "need to control" things for herself and others.    Interventions: Cognitive Behavioral Therapy, Ego-Supportive, and Insight-Oriented  Diagnosis:   ICD-10-CM   1. Generalized anxiety disorder  F41.1        Treatment Goal plan: Patient not signing Tx Plan on computer screen due to Covid. Treatment Goals: Goals remain on Tx Plan as patient works with strategies to achieve her goals.  Progress will be documented each session under "Progress" section of Plan.  Long Term goal: Develop healthy cognitive patterns and beliefs about self and the world that lead to alleviation and help to prevent relapse of depression or anxiety. Short Term goal: Identify and replace depressive/anxious thinking that leads to depressive/anxious feelings and actions.Elevate her motivation and self-esteem.  Strategy: Educate the patient about cognitive restructuring including self-monitoring of automatic thoughts reflecting depressive/negative, and anxious beliefs.      Plan:  Patient today showing really good motivation and engagement in session and working on some tough issues regarding her care of her sister but also her self-care and some depression, anxiety, frustration, and significant overwhelmedness.  Seemed to have a renewed sense of the need to take care of herself in order to help care for others within the family.  Encouraged her to practice some behaviors that have  been helpful to her previously including: Stop being self judgmental, healthy nutrition and exercise, using healthy boundaries with others as needed, working on her automatic thoughts that are negative and self judgmental and trying to change them to be more self accepting, consistent  positive self talk, letting go of past guilt, intentionally looking for more positives daily, practice being more proactive versus reactive in situations, search for positives within herself, stay in contact with people who are supportive, stay in the present focusing on what she can control, decrease overthinking and overanalyzing, get outside daily and walk, and feel good about the strength she is showing as she works with goal-directed behaviors in trying to move forward in a more positive direction of improved emotional and physical health.  Goal review and progress/challenges noted with patient.  Next appointment within 3 weeks.   Mathis Fare, LCSW

## 2021-04-24 ENCOUNTER — Ambulatory Visit: Admitting: Psychiatry

## 2021-04-24 ENCOUNTER — Other Ambulatory Visit: Payer: Self-pay

## 2021-04-24 DIAGNOSIS — F411 Generalized anxiety disorder: Secondary | ICD-10-CM | POA: Diagnosis not present

## 2021-04-24 NOTE — Progress Notes (Signed)
[facility] Counselor/Therapist Progress Note  Patient ID: Denise Liu, MRN: [id number],    Date: 04/24/2021  Time Spent: 58 minutes   Treatment Type: Individual Therapy  Reported Symptoms: anxiety, depression  Mental Status Exam:  Appearance:   Casual     Behavior:  Appropriate and Sharing  Motor:  Normal  Speech/Language:   Clear and Coherent  Affect:  Anxious, some depression  Mood:  anxious and depressed  Thought process:  goal directed  Thought content:    WNL  Sensory/Perceptual disturbances:    WNL  Orientation:  oriented to person, place, time/date, situation, day of week, month of year, year, and stated date of Aug. 24, 2022  Attention:  Good  Concentration:  Good and Fair  Memory:  WNL  Fund of knowledge:   Good  Insight:    Good and Fair  Judgment:   Good  Impulse Control:  Good and Fair   Risk Assessment: Danger to Self:  No Self-injurious Behavior: No Danger to Others: No Duty to Warn:no Physical Aggression / Violence:No  Access to Firearms a concern: No  Gang Involvement:No   Subjective: Patient in today reporting anxiety, depression, and difficulty with motivation.  Went back to an earlier dosage she was on with her Zoloft and that seems to be helping some with some increased energy, and some less depression. "I haven't been very good at making list so as to get things done. Has been very involved in  helping get her sister settled into her room at local long term care facility. Processed a lot of her anxious, depressive, and some sad thoughts re: personal, family, and helping herself to get "more motivated".  The motivation piece we worked on more as that influences so much else of what patient is working on.  She feels like using a calendar and list will help her as well as starting back in some activities she had once been involved in, and starting back after Labor Day which is the time frame at least one of the activities at church begins again.   Did seem to be more positive and future oriented as she was leaving today and less negative.  Also some decrease in her anxiety and depression and was able to look at some positives within herself and things that she has done more recently especially in helping her sister and dealing with some of her own sadness regarding sister's situation.  Not feeling as overwhelmed and depression has lessened some which she feels like talking things through like today has really helped her sort through a lot of thoughts and feelings and be at a better place herself to move forward.  Interventions: Cognitive Behavioral Therapy and Solution-Oriented/Positive Psychology  Diagnosis:   ICD-10-CM   1. Generalized anxiety disorder  F41.1       Treatment Goal plan: Patient not signing Tx Plan on computer screen due to Covid. Treatment Goals: Goals remain on Tx Plan as patient works with strategies to achieve her goals.  Progress will be documented each session under "Progress" section of Plan.  Long Term goal: Develop healthy cognitive patterns and beliefs about self and the world that lead to alleviation and help to prevent relapse of depression or anxiety. Short Term goal: Identify and replace depressive/anxious thinking that leads to depressive/anxious feelings and actions.Elevate her motivation and self-esteem.  Strategy: Educate the patient about cognitive restructuring including self-monitoring of automatic thoughts reflecting depressive/negative, and anxious beliefs.      Plan:  Patient today showing minimal motivation initially but that improved some during session when she was very verbal and expressing some emotion regarding her feelings about herself and not staying involved in various activities that she had been involved in, and also getting herself in a bit of a slump after letting go of those things.  Worked well as noted above, on reestablishing a plan going forward which she wants to start right  after Labor Day as some of the activities of interest to her began again after Labor Day.  Showing better motivation and engagement in session by the end of our time today.  Encouraged her to practice some behaviors that have been helpful previously including: Using healthy boundaries with others as needed, stop being self judgmental, healthy nutrition and exercise, working on her automatic thoughts that are negative and self judgmental and trying to change them to be more self accepting, consistent positive self talk, intentionally looking for more positives daily, letting go of past guilt, practice being more proactive versus reactive in situations, search for positives within herself, decrease overthinking and overanalyzing, stay in contact with people who are supportive, remain in the present focusing on what she can control, get outside daily and walk, and recognize the strength she is showing as she works with goal-directed behaviors in trying to move forward in a more positive direction of improved emotional and physical health.  Goal review and progress/challenges noted with patient.  Next appointment within 2 to 3 weeks.   Mathis Fare, LCSW

## 2021-05-16 ENCOUNTER — Ambulatory Visit: Admitting: Psychiatry

## 2021-05-28 ENCOUNTER — Other Ambulatory Visit: Payer: Self-pay

## 2021-05-28 ENCOUNTER — Ambulatory Visit: Admitting: Psychiatry

## 2021-05-28 DIAGNOSIS — F411 Generalized anxiety disorder: Secondary | ICD-10-CM | POA: Diagnosis not present

## 2021-05-28 NOTE — Progress Notes (Signed)
[facility] Counselor/Therapist Progress Note  Patient ID: Denise Liu, MRN: [id number],    Date: 05/28/2021  Time Spent: 60  minutes   Treatment Type: Individual Therapy  Reported Symptoms: anxiety, some decreased energy "but have been busier more recently"  Mental Status Exam:  Appearance:   Neat     Behavior:  Appropriate, Sharing, and Motivated  Motor:  Normal  Speech/Language:   Clear and Coherent  Affect:  anxious  Mood:  anxious  Thought process:  goal directed  Thought content:    Some obsessiveness  Sensory/Perceptual disturbances:    WNL  Orientation:  oriented to person, place, time/date, situation, day of week, month of year, year, and stated date of Sept 27, 2022  Attention:  Good  Concentration:  Good  Memory:  WNL  Fund of knowledge:   Good  Insight:    Good and Fair  Judgment:   Good  Impulse Control:  Good and Fair   Risk Assessment: Danger to Self:  No Self-injurious Behavior: No Danger to Others: No Duty to Warn:no Physical Aggression / Violence:No  Access to Firearms a concern: No  Gang Involvement:No   Subjective:  Patient in today reporting anxiety, and feeling less energy, but adds she's been busier more recently and has been very involved in meeting needs of her sister who is placed in assisted living environment. Shared her journaling  in which she had written info about her goals and what she's wanting in life now: "peace and joy".  Feels almost addicted to  media and hard to set limits with it. Also feeling pressure of doing so much to help her sister, as noted above. Needing to focus some on not over-committing her self and her time, yet also be involved in activities that are meaningful for patient.  Discussed her priorities, the needs for her sister with which patient is working, activities patient wants to be involved in, and how to be involved more with her adult friends and church. Concerned about her not following through on her  stop using food for comfort, and processed her thoughts/feelings on this and how difficult this is for her, but adds at end of session she feels that she has really not put forth the effort needed and is feeling more committed at this point.  Finding the positives within herself is very important, and making clear decisions and making choices that are aligned with those decisions will be needed.  Interventions: Solution-Oriented/Positive Psychology and Insight-Oriented  Diagnosis:   ICD-10-CM   1. Generalized anxiety disorder  F41.1      Treatment Goal plan: Patient not signing Tx Plan on computer screen due to Covid. Treatment Goals: Goals remain on Tx Plan as patient works with strategies to achieve her goals.  Progress will be documented each session under "Progress" section of Plan.  Long Term goal: Develop healthy cognitive patterns and beliefs about self and the world that lead to alleviation and help to prevent relapse of depression or anxiety. Short Term goal: Identify and replace depressive/anxious thinking that leads to depressive/anxious feelings and actions.Elevate her motivation and self-esteem.  Strategy: Educate the patient about cognitive restructuring including self-monitoring of automatic thoughts reflecting depressive/negative, and anxious beliefs.     Plan: Patient today showing motivation and participation in session although she was not feeling as energetic as more recently she has been very overwhelmed in helping her sister that is moved in to an assisted living facility.  A lot of things that  patient feels she is still needing to help out with but hopeful of having some boundaries soon.  Went on a "pilgrimage" with some people from her church and that was helpful although stirred up a lot of questions and self-examination for patient, some of which she shared today to further process.  Encouraged patient in practicing positive behaviors that can be helpful to her between  sessions including: Using healthy boundaries with others as needed, having healthy boundaries regarding how she uses her time and getting tasks done, saying no when she needs to say no, stop being so self judgmental, healthy nutrition and exercise, working on her automatic thoughts that tend to be negative and self judgmental and trying to change them to be more self accepting, more consistent positive self talk, intentionally looking for more positives daily, letting go of past guilt, practice being more proactive versus reactive in situations, search for positives within herself, decrease overthinking and over analyzing, stay in contact with people who are supportive, remain in the present focusing on what she can control, get outside daily and walk, and recognize the strength she shows as she works with goal-directed behaviors to move in a direction that supports improved emotional and physical health.   Goal review and progress/challenges noted with patient.  Next appointment within 2 to 3 weeks.  This record has been created using [employer].  Chart creation errors have been sought, but may not always have been located and corrected.  Such creation errors do not reflect on the standard of medical care provided.    Mathis Fare, LCSW

## 2021-05-28 NOTE — Progress Notes (Deleted)
      [facility] Counselor/Therapist Progress Note  Patient ID: Denise Liu, MRN: [id number],    Date: 05/28/2021  Time Spent: ***   Treatment Type: {[facility] AMB THERAPY TYPES:[id number]}  Reported Symptoms: ***  Mental Status Exam:  Appearance:   {PSY:22683}     Behavior:  {PSY:[id number]}  Motor:  {PSY:22302}  Speech/Language:   {PSY:22685}  Affect:  {PSY:22687}  Mood:  {PSY:31886}  Thought process:  {PSY:31888}  Thought content:    {[phone]}  Sensory/Perceptual disturbances:    {PSY:[phone]}  Orientation:  {PSY:30297}  Attention:  {PSY:22877}  Concentration:  {[phone]}  Memory:  {PSY:251-700-1620}  Fund of knowledge:   {[phone]}  Insight:    {[phone]}  Judgment:   {[phone]}  Impulse Control:  {[phone]}   Risk Assessment: Danger to Self:  {PSY:22692} Self-injurious Behavior: {PSY:22692} Danger to Others: {PSY:22692} Duty to Warn:{PSY:311194} Physical Aggression / Violence:{PSY:21197} Access to Firearms a concern: {PSY:21197} Gang Involvement:{PSY:21197}  Subjective: ***       Interventions: {PSY:[phone]}  Diagnosis:No diagnosis found.        Plan: ***       Mathis Fare, LCSW

## 2021-06-13 ENCOUNTER — Other Ambulatory Visit: Payer: Self-pay

## 2021-06-13 ENCOUNTER — Ambulatory Visit: Admitting: Psychiatry

## 2021-06-13 DIAGNOSIS — F331 Major depressive disorder, recurrent, moderate: Secondary | ICD-10-CM | POA: Diagnosis not present

## 2021-06-13 NOTE — Progress Notes (Signed)
[facility] Counselor/Therapist Progress Note  Patient ID: Denise Liu, MRN: [id number],    Date: 06/13/2021  Time Spent:  58 minutes  Treatment Type: Individual Therapy  Reported Symptoms: depression stronger symptom, anxiety, procrastination  Mental Status Exam:  Appearance:   Well Groomed     Behavior:  Appropriate, Sharing, and Motivated  Motor:  Normal  Speech/Language:   Clear and Coherent  Affect:  Depressed and some anxiety  Mood:  depressed  Thought process:  goal directed  Thought content:    overthinking  Sensory/Perceptual disturbances:    WNL  Orientation:  oriented to person, place, time/date, situation, day of week, month of year, year, and stated date of Oct. 13, 2022  Attention:  Fair  Concentration:  Fair  Memory:  WNL  Fund of knowledge:   Good  Insight:    Good  Judgment:   Good and Fair  Impulse Control:  Good and Fair   Risk Assessment: Danger to Self:  No Self-injurious Behavior: No Danger to Others: No Duty to Warn:no Physical Aggression / Violence:No  Access to Firearms a concern: No  Gang Involvement:No   Subjective: Patient in today reporting depression and just don't feel like doing anything.  Anxiety also but not as strong as depression. No SI. Procrastination. Thinks all the work she is doing for her sister who was placed in Assisted Living recently, has had a "depressive and overwhelming effect on me." Has spent a lot of time in recent weeks helping in many ways to get her sister admitted to facility, and has had experienced a lot of unexpected stressors and obstacles. Looked at some much needed boundaries with her older sister and how to communicate more effectively about healthy boundaries and setting needed limits. Practiced in session todays, ways to proactively address the situation with her sister in healthier ways. Vented a lot of her frustration and "pent up anxiety, frustration, and being pulled in multiple directions til  I'm just overwhelmed."  Patient agreed with suggestions today that she feels can be helpful to her as noted above. Still working on getting off her electronics a little more and doesn't want "to feel addicted to it." Also trying to decrease her "over-committing to others" especially during this time of heightened stress. More insight and making some better choices for herself  Interventions: Solution-Oriented/Positive Psychology, Ego-Supportive, and Insight-Oriented  Diagnosis:   ICD-10-CM   1. Major depressive disorder, recurrent episode, moderate (HCC)  F33.1       Treatment Goal plan: Patient not signing Tx Plan on computer screen due to Covid. Treatment Goals: Goals remain on Tx Plan as patient works with strategies to achieve her goals.  Progress will be documented each session under "Progress" section of Plan.  Long Term goal: Develop healthy cognitive patterns and beliefs about self and the world that lead to alleviation and help to prevent relapse of depression or anxiety. Short Term goal: Identify and replace depressive/anxious thinking that leads to depressive/anxious feelings and actions.Elevate her motivation and self-esteem.  Strategy: Educate the patient about cognitive restructuring including self-monitoring of automatic thoughts reflecting depressive/negative, and anxious beliefs.     Plan: Patient today showing good motivation and actively involved in session today, working on stress, depression, and some anxiety related mostly to her involvement and concerns with her older sister's placement in long term facility and having lots of adjustment issues when is also heavily impacting patient as well. Worked well in looking in identifying "what helps and what  does not" with her sister and with herself as she tries to cope more effectively with a challenging health situation.  Encouraged patient in practicing positive behaviors that have been helpful to her in the past including:  Saying no when she needs to say no, having healthy boundaries regarding how she uses her time and getting tasks done, using healthy boundaries with others as needed, stop being judgmental with herself, healthy nutrition and exercise, working on her automatic thoughts that tend to be negative/self judgmental and trying to change them to be more self accepting, more consistent positive self talk, intentionally looking for more positives daily, letting go of past guilt, practice being more proactive versus reactive in situations, setting limits with electronics, search for the positives within herself, decrease overthinking and over analyzing, stay in contact with people who are supportive, remain in the present focusing on what she can control, get outside daily and walk, and recognize the strength she shows as she works with goal-directed behaviors to move in a direction that supports improved emotional health.  Goal review and progress/challenges noted with patient.  Next appointment within 2 to 3 weeks.  This record has been created using [employer].  Chart creation errors have been sought, but may not always have been located and corrected.  Such creation errors do not reflect on the standard of medical care provided.    Mathis Fare, LCSW

## 2021-06-27 ENCOUNTER — Other Ambulatory Visit: Payer: Self-pay

## 2021-06-27 ENCOUNTER — Ambulatory Visit: Admitting: Psychiatry

## 2021-06-27 DIAGNOSIS — F331 Major depressive disorder, recurrent, moderate: Secondary | ICD-10-CM

## 2021-06-27 NOTE — Progress Notes (Signed)
[facility] Counselor/Therapist Progress Note  Patient ID: Denise Liu, MRN: [id number],    Date: 06/27/2021  Time Spent: 55 minutes   Treatment Type: Individual Therapy  Reported Symptoms: depression, anxiety ("depression is the stronger symptom")  Mental Status Exam:  Appearance:   Casual     Behavior:  Appropriate, Sharing, and lower motivation  Motor:  Normal  Speech/Language:   Clear and Coherent  Affect:  Depressed and anxiety  Mood:  anxious and depressed  Thought process:  goal directed  Thought content:    Some avoidance, some obsessiveness  Sensory/Perceptual disturbances:    WNL  Orientation:  oriented to person, place, time/date, situation, day of week, month of year, year, and stated date of Oct. 27, 2022  Attention:  Good  Concentration:  Good and Fair  Memory:  WNL  Fund of knowledge:   Good  Insight:    Good  Judgment:   Good and Fair  Impulse Control:  Good   Risk Assessment: Danger to Self:  No Self-injurious Behavior: No Danger to Others: No Duty to Warn:no Physical Aggression / Violence:No  Access to Firearms a concern: No  Gang Involvement:No   Subjective: Patient in today reporting depression and anxiety, with depression being the stronger symptom.  Processed a lot of her lack of balance in her life, especially in self-care.  A lot of this is come in recent weeks where she had to help care for her sister and get her situated in an assisted living community.  Feeling more down on herself today and seeing "more of her failures than any successes".  Denies any SI.  Is tired after spending a lot of time helping her sister get moved and trying to help her adjust to her new surroundings, running errands, and many visits to see her sister.  Today feeling more deflated and tending to make broader generalizations of how she has not made the changes she wanted to make over time.  In reality, patient has made some gains emotionally however these last few  weeks have really been stressful for her and hard for her to set good limits which I think is heavily contributed to her feeling more depressed along with some anxiety.  She tended to agree with this and we worked on a plan for her to prioritize some of her own needs and wants and still be able to have some time to check on her sister, and realize that the sister's needs are not quite as crucial right now since patient has helped her get into a placement and she is adjusting.  Patient realizing "I have to decide that I am going to make myself a priority".  We agreed on this and ways that she can move forward in making herself a priority and can end up feeling better emotionally on a more consistent basis as she continues to keep herself a priority.  Also encouraged patient to schedule another med check appointment with her med provider here as she has not seen in several months.  Patient is scheduling that appointment before she leaves our office today.  Did share some things that she would like to do that would bring her pleasure which I thought was a really good start for her and changing up her daily routines and making her own needs a part of what she prioritizes.   Interventions: Cognitive Behavioral Therapy, Ego-Supportive, and Insight-Oriented  Diagnosis:   ICD-10-CM   1. Major depressive disorder, recurrent episode, moderate (  HCC)  F33.1      Treatment Goal plan: Patient not signing Tx Plan on computer screen due to Covid. Treatment Goals: Goals remain on Tx Plan as patient works with strategies to achieve her goals.  Progress will be documented each session under "Progress" section of Plan.  Long Term goal: Develop healthy cognitive patterns and beliefs about self and the world that lead to alleviation and help to prevent relapse of depression or anxiety. Short Term goal: Identify and replace depressive/anxious thinking that leads to depressive/anxious feelings and actions.Elevate her  motivation and self-esteem.  Strategy: Educate the patient about cognitive restructuring including self-monitoring of automatic thoughts reflecting depressive/negative, and anxious beliefs.    Plan:  Patient today showing low motivation initially but it did get better over the course of session and she became much more actively involved, and by the end of session was commenting that she was feeling more optimistic and hopeful.  As noted above, she has been very consumed in recent weeks with getting her sister placed into an assisted living facility which has needed a lot of patient's time and energy.  Patient feeling more depressed and deflated today, along with some anxiety, and she stated she felt it was strongly related to the journey she has been on with her sister these past few months, and not setting aside time for very much self-care.  We discussed this at length today and patient has recommitted to making that a priority and seem to show more motivation towards the end of session. Encouraged her to be practicing positive behaviors that have helped her in the past including: Having healthy boundaries regarding how she uses her time and getting tasks done, using healthy boundaries with others as needed, saying no when she needs to say no, stop being judgmental with herself, healthy nutrition and exercise, working on her automatic thoughts that tend to be negative/self judgmental and trying to change them to be more self accepting, more consistent positive self talk, intentionally looking for more positives daily, letting go of past guilt, practice being more proactive versus reactive in situations, setting limits with her electronics, search for the positives within herself, decrease overthinking and over analyzing, stay in contact with people who are supportive, remain in the present focusing on what she can control, get outside daily and walk, believe in herself more, pay closer attention to her own  self-care and make choices that reflect improved self-care, and realize the strength she shows working with goal-directed behaviors to move in a direction that supports improved emotional health and overall wellbeing.  Goal review and progress/challenges noted with patient.  Next appointment within 2 to 3 weeks.  This record has been created using [employer].  Chart creation errors have been sought, but may not always have been located and corrected.  Such creation errors do not reflect on the standard of medical care provided.    Mathis Fare, LCSW

## 2021-07-11 ENCOUNTER — Ambulatory Visit: Admitting: Psychiatry

## 2021-07-11 ENCOUNTER — Other Ambulatory Visit: Payer: Self-pay

## 2021-07-11 DIAGNOSIS — F331 Major depressive disorder, recurrent, moderate: Secondary | ICD-10-CM

## 2021-07-11 NOTE — Progress Notes (Signed)
[facility] Counselor/Therapist Progress Note  Patient ID: Denise Liu, MRN: [id number],    Date: 07/11/2021  Time Spent: 58 minutes   Treatment Type: Individual Therapy  Reported Symptoms: depression, anxiety,some overwhelmedness, frustrated when things don't go as planned  Mental Status Exam:  Appearance:   Casual     Behavior:  Appropriate, Sharing, and some motivated  Motor:  Normal  Speech/Language:   Clear and Coherent  Affect:  Depressed and anxious  Mood:  anxious and depressed  Thought process:  goal directed  Thought content:    Some rumination and overthinking  Sensory/Perceptual disturbances:    WNL  Orientation:  oriented to person, place, time/date, situation, day of week, month of year, year, and stated date of Nov. 10, 2022  Attention:  Good  Concentration:  Good and Fair  Memory:  WNL  Fund of knowledge:   Good  Insight:    Good and Fair  Judgment:   Good  Impulse Control:  Good and Fair   Risk Assessment: Danger to Self:  No Self-injurious Behavior: No Danger to Others: No Duty to Warn:no Physical Aggression / Violence:No  Access to Firearms a concern: No  Gang Involvement:No   Subjective:  Patient in today reporting anxiety and depression (depression is stronger symptom), feeling overwhelmed, frustration especially when things don't go as planned. Denies any SI. Symptoms mostly related to patient's overseeing of her older sister's placement in assisted living, patient's own emotional stressors, and family member with recently diagnosed with a brain tumor. Processed these stressors and their impact on her emotionally. Patient did seem to gain strength and motivation as we worked through these issues with patient and let herself experience more of her feelings versus just trying to "shove it back".  Referring to goal-directed behaviors, discussed more today about her "lack of balance in her life especially improved self-care."   Interventions:  Solution-Oriented/Positive Psychology, Ego-Supportive, and Insight-Oriented  Diagnosis:   ICD-10-CM   1. Major depressive disorder, recurrent episode, moderate (HCC)  F33.1      Treatment Goal plan: Patient not signing Tx Plan on computer screen due to Covid. Treatment Goals: Goals remain on Tx Plan as patient works with strategies to achieve her goals.  Progress will be documented each session under "Progress" section of Plan.  Long Term goal: Develop healthy cognitive patterns and beliefs about self and the world that lead to alleviation and help to prevent relapse of depression or anxiety. Short Term goal: Identify and replace depressive/anxious thinking that leads to depressive/anxious feelings and actions.Elevate her motivation and self-esteem.  Strategy: Educate the patient about cognitive restructuring including self-monitoring of automatic thoughts reflecting depressive/negative, and anxious beliefs.     Plan: Patient today showing some motivation and it did improve some over the session today with encouragement and pointing out some of her positives, as she worked on her anxiety, depression, and frustration.  Looked at some much-needed boundary setting and improved self-care as well as strategies and behaviors that can help make this happen for her.  She has a lot of responsibilities within her family and extended family and committed today that she is going to set better limits and carve out some time specifically for her own self-care, and also looked at what seems to get in the way of her having better self-care.  Encouraged patient and her practice of positive behaviors that have helped her previously including: Believing in herself more, using healthy boundaries regarding how she spends her time and  getting tasks done, maintaining healthy boundaries with other people, saying no when she needs to say no, stop being judgmental with herself, healthy nutrition and exercise, working on  her automatic thoughts that tend to be negative/self judgmental and trying to change them to be more self accepting, consistent positive self talk, intentionally looking for more positives daily, practice being more proactive versus reactive in situations, letting go of past guilt that holds her back, spending more time off of her electronics, search for the positives within herself, decrease overthinking and over analyzing, remain in contact with people who are supportive, stay in the present focusing on what she can control, get outside daily and walk, pay closer attention to her own self-care and make choices that reflect improved self-care, and feel good about the strength she shows working with goal-directed behaviors to move in a direction that supports improved emotional health.  Goal review and progress/challenges noted with patient.  Next appointment within 2 to 3 weeks.  This record has been created using [employer].  Chart creation errors have been sought, but may not always have been located and corrected.  Such creation errors do not reflect on the standard of medical care provided.    Mathis Fare, LCSW

## 2021-07-23 ENCOUNTER — Other Ambulatory Visit: Payer: Self-pay

## 2021-07-23 ENCOUNTER — Ambulatory Visit (INDEPENDENT_AMBULATORY_CARE_PROVIDER_SITE_OTHER): Admitting: Psychiatry

## 2021-07-23 DIAGNOSIS — F411 Generalized anxiety disorder: Secondary | ICD-10-CM | POA: Diagnosis not present

## 2021-07-23 NOTE — Progress Notes (Signed)
[facility] Counselor/Therapist Progress Note  Patient ID: Denise Liu, MRN: [id number],    Date: 07/23/2021  Time Spent: 58 minutes   Treatment Type: Individual Therapy  Reported Symptoms:  anxiety ("main symptom:"), depression, frustration  Mental Status Exam:  Appearance:   Casual and Neat     Behavior:  Appropriate and Sharing Motivated  Motor:  Normal  Speech/Language:   Clear and Coherent  Affect:  anxious  depressed  Mood:  anxious and depressed  Thought process:  goal directed  Thought content:    Some overthinking and obsessiveness  Sensory/Perceptual disturbances:    WNL  Orientation:  oriented to person, place, time/date, situation, day of week, month of year, year, and stated date of Nov. 22, 2022  Attention:  Fair  Concentration:  Fair  Memory:  Patient has some concerns and Dr is doing test next week.  Fund of knowledge:   Good  Insight:    Good and Fair  Judgment:   Good  Impulse Control:  Good   Risk Assessment: Danger to Self:  No Self-injurious Behavior: No Danger to Others: No Duty to Warn:no Physical Aggression / Violence:No  Access to Firearms a concern: No  Gang Involvement:No   Subjective: Patient in today reporting anxiety as main symptom, along with depression and frustration. Still needing to help significantly with her older sister living in assisted living facility now, which leads to much work and frustration for patient as she finds it difficult to work with facility. Trying to manage her frustrations and some anger more effectively. Working to structure her time more effectively to have dependable day in helping sister each week and the other days for patient to have for herself. Focus on difficulty not procrastinating and looked at some creative ways for patient to confront her procrastination and keep moving forward versus getting stuck in procrastinating. Processed more of her feelings today about family member with recent  diagnosis of brain tumor. Is working to improve her own self-care including exercise, better task completion, some down time to just do some things for fun,   Interventions: Solution-Oriented/Positive Psychology, Ego-Supportive, and Insight-Oriented    Treatment Goal plan: Patient not signing Tx Plan on computer screen due to Covid. Treatment Goals: Goals remain on Tx Plan as patient works with strategies to achieve her goals.  Progress will be documented each session under "Progress" section of Plan.  Long Term goal: Develop healthy cognitive patterns and beliefs about self and the world that lead to alleviation and help to prevent relapse of depression or anxiety. Short Term goal: Identify and replace depressive/anxious thinking that leads to depressive/anxious feelings and actions.Elevate her motivation and self-esteem.  Strategy: Educate the patient about cognitive restructuring including self-monitoring of automatic thoughts reflecting depressive/negative, and anxious beliefs.    Diagnosis:   ICD-10-CM   1. Generalized anxiety disorder  F41.1       Plan:  Patient today showing motivation but admits it's been more difficult to motivate herself recently.  Notices that she does better when I help motivate her in sessions and today we talked about how she can do that more for herself outside the therapy office. Pointed out her positives. Also reviewed boundary issues and better implementation in particular situations or with particular individuals. Encouraged patient in her practice of positive behaviors that are helpful to her including:   Believing in herself more. Keeping healthy boundaries with others. Using healthy boundaries with how she spends her time and getting tasks done.  Consistent positive self-talk. Saying no when she needs to say no. Stop being judgmental with herself. Healthy nutrition and exercise. Working on her automatic thoughts that tend to be negative and  changed to more self accepting. Intentionally look for more positives each day. Practice being more proactive versus reactive in situations. Get outside and walk some each day as she is able. Letting go of past guilt that holds her back. Spending more time off her electronics. Find the positives within herself. Decrease overthinking and over analyzing. Stay in contact with people who are supportive. Stay in the present focusing on what she can change. Pay close attention to her own self-care and make choices that reflect improved self-care. Recognize the strengths she shows working with goal-directed behaviors to move in a direction that supports improved emotional health and overall wellbeing.  Goal review and progress/challenges noted with patient.  Appointment within 2 to 3 weeks.  This record has been created using [employer].  Chart creation errors have been sought, but may not always have been located and corrected.  Such creation errors do not reflect on the standard of medical care provided.   Mathis Fare, LCSW

## 2021-07-30 ENCOUNTER — Ambulatory Visit: Admitting: Physician Assistant

## 2021-08-06 ENCOUNTER — Ambulatory Visit: Admitting: Psychiatry

## 2021-08-06 ENCOUNTER — Other Ambulatory Visit: Payer: Self-pay

## 2021-08-06 DIAGNOSIS — F411 Generalized anxiety disorder: Secondary | ICD-10-CM

## 2021-08-06 NOTE — Progress Notes (Signed)
[facility] Counselor/Therapist Progress Note  Patient ID: Denise Liu, MRN: [id number],    Date: 08/06/2021  Time Spent: 57 minutes                          Treatment Type: Individual Therapy  Reported Symptoms: anxiety heightened some more recently, depression some better, some motivation issues            Mental Status Exam:  Appearance:   Casual     Behavior:  Appropriate, Sharing, and struggling with motivation  Motor:  Normal  Speech/Language:   Clear and Coherent  Affect:  Depressed and anxious  Mood:  anxious and depressed  Thought process:  goal directed  Thought content:    Some overthinking  Sensory/Perceptual disturbances:    WNL  Orientation:  oriented to person, place, time/date, situation, day of week, month of year, year, and stated date of Dec. 6, 2022  Attention:  Good  Concentration:  Fair  Memory:  "Some short term memory issues sometimes"  Fund of knowledge:   Good  Insight:    Good  Judgment:   Good  Impulse Control:  Fair   Risk Assessment: Danger to Self:  No Self-injurious Behavior: No Danger to Others: No Duty to Warn:no Physical Aggression / Violence:No  Access to Firearms a concern: No  Gang Involvement:No   Subjective:  Patient in today reporting anxiety increased, depression improved some, and some difficulty motivating herself, although motivated in session today. Upset today over "ordeal" earlier this a.m. at the nursing home where her sister is placed. Feeling lots of pressure from sister and staff at the nursing home and needed part of session today to vent and figure out best way to problem-solve situation with sister's placement which continues to be problematic and patient is sister's main support and helps handle her affairs. Anxiety is more elevated and patient trying to  cope better with it and set limits to not hold onto the anxiety.  "Don't like myself, not setting limits. "Gets sucked in to sister's issues at nursing  facilty.Worked on her frustration, healthier limit setting, hanging onto things discussed in session and being able to apply them outside of session, procrastination, saying no when needed, and her own personal self-care needing to be more of a priority and we will follow-up on this more next session.       Interventions: Solution-Oriented/Positive Psychology, Ego-Supportive, and Insight-Oriented   Treatment Goal plan: Patient not signing Tx Plan on computer screen due to Covid. Treatment Goals: Goals remain on Tx Plan as patient works with strategies to achieve her goals.  Progress will be documented each session under "Progress" section of Plan.  Long Term goal: Develop healthy cognitive patterns and beliefs about self and the world that lead to alleviation and help to prevent relapse of depression or anxiety. Short Term goal: Identify and replace depressive/anxious thinking that leads to depressive/anxious feelings and actions.Elevate her motivation and self-esteem.  Strategy: Educate the patient about cognitive restructuring including self-monitoring of automatic thoughts reflecting depressive/negative, and anxious beliefs.     Diagnosis:   ICD-10-CM   1. Generalized anxiety disorder  F41.1       Plan: Patient today showing mild motivation initially but did become more motivated the more we talked in session.  Worked today on learning to love herself more and take better care of herself, frustrations, setting healthier limits, retaining more of the things that we discussed in  session and being able to apply them outside of session, setting personal limits with others, more focus on her own personal self-care, trying to interrupt the habit of procrastination, and discussing some ways of possibly improving her communication and boundary setting with her sister who patient had to help place in an assisted living care facility.  Emphasized her positives in addition to her need areas.   Encouraged patient and her practice of positive behaviors that can be helpful to her including:  Believing more in herself and her ability to make significant changes. Using healthy boundaries with how she spends her time and getting tasks done. Keeping healthy boundaries with others. Consistent positive self talk. Saying no when she needs to say no without guilt. Stop being judgmental with herself.  Spending more time off of her electronics. Healthy nutrition and exercise. Making better choices for herself that reflect healthier self-care. Working with automatic thoughts that are negative; change them to be more self accepting. Intentionally look for more positives each day. Practice being more proactive versus reactive in situations. Get outside and walk some each day as she is able. Letting go of past guilt that holds her back from moving forward. Search for the positives within herself. Decrease overthinking and over analyzing. Staying in the present focusing on what she can change or control. Staying in contact with people who are supportive. Pay close attention to how she views herself, emphasizing her strengths. Feel good about the strength she shows working with goal-directed behaviors to move in a direction that supports improved emotional health.   Goal review and progress/challenges noted with patient.  Next appointment within 2 to 3 weeks.  This record has been created using [employer].  Chart creation errors have been sought, but may not always have been located and corrected.  Such creation errors do not reflect on the standard of medical care provided.    Mathis Fare, LCSW

## 2021-09-05 ENCOUNTER — Other Ambulatory Visit: Payer: Self-pay

## 2021-09-05 ENCOUNTER — Ambulatory Visit: Admitting: Psychiatry

## 2021-09-05 DIAGNOSIS — F411 Generalized anxiety disorder: Secondary | ICD-10-CM

## 2021-09-05 NOTE — Progress Notes (Signed)
[facility] Counselor/Therapist Progress Note  Patient ID: Lyrick Worland, MRN: [id number],    Date: 09/05/2021  Time Spent: 55 minutes   Treatment Type: Individual Therapy  Reported Symptoms: depression, anxiety, difficulty concentrating in stressful situations, self-doubt, negative self-talk, unresolved feelings of "being left-out that are very hurtful"  Mental Status Exam:  Appearance:   Casual     Behavior:  Appropriate, Sharing, and Motivated  Motor:  Normal  Speech/Language:   Clear and Coherent  Affect:  Depressed and anxious  Mood:  anxious and depressed  Thought process:  goal directed  Thought content:    Overthinking and some obsessiveness  Sensory/Perceptual disturbances:    WNL  Orientation:  oriented to person, place, time/date, situation, day of week, month of year, year, and stated date of Jan. 5, 2023  Attention:  Good  Concentration:  Good and Fair  Memory:  WNL  Fund of knowledge:   Good  Insight:    Good and Fair  Judgment:   Good  Impulse Control:  Good   Risk Assessment: Danger to Self:  No Self-injurious Behavior: No Danger to Others: No Duty to Warn:no Physical Aggression / Violence:No  Access to Firearms a concern: No  Gang Involvement:No   Subjective:  Patient in today reporting anxiety, depression, difficulty concentrating in stressful situations, self-doubt, negative self-talk, and unresolved feelings of being left out. Today needed to work on negative self-talk/self-doubt/unresolved feelings of being left out which she did in session today. Beginning to see more of her destructive long-held thoughts of others not liking her, accepting her, or feeling negatively towards her. Discussed this in detail today and was able to see "a different side to all this" and realizing her need to let go of some old very negative perceptions of herself and how others may feel about her. To do some homework on this between sessions and will follow up next  visit.  Does seem to be setting clearer boundaries for her sister that she is helping at a nursing facility.   Interventions: Solution-Oriented/Positive Psychology, Ego-Supportive, and Insight-Oriented   Treatment Goal plan: Patient not signing Tx Plan on computer screen due to Covid. Treatment Goals: Goals remain on Tx Plan as patient works with strategies to achieve her goals.  Progress will be documented each session under "Progress" section of Plan.  Long Term goal: Develop healthy cognitive patterns and beliefs about self and the world that lead to alleviation and help to prevent relapse of depression or anxiety. Short Term goal: Identify and replace depressive/anxious thinking that leads to depressive/anxious feelings and actions.Elevate her motivation and self-esteem.  Strategy: Educate the patient about cognitive restructuring including self-monitoring of automatic thoughts reflecting depressive/negative, and anxious beliefs  Diagnosis:   ICD-10-CM   1. Generalized anxiety disorder  F41.1      Plan: Patient today showing motivation which did get stronger over the course of session as she worked on some of her long-held destructive thoughts about how others "felt negatively towards her".  Had not spoken about this much previously but was much more aware of it over the recent holidays and her self-awareness seemed to kick in more, realizing how the negative thoughts were holding her back and feeding more anxiety, negativity, doubt, and depression.  Did quite well and working on these issues in session and seemed to be more determined to continue her work of realizing some of her prior thoughts and assumptions were inaccurate and her need to let go of these negative  thoughts that support her anxiety and depression.  Worked with some specific examples today in session which seemed very helpful to patient.  As noted above, she is to continue working on this between sessions and we will  follow-up in her next session.  Insight noticeably improved on these issues.  Encouraged patient in her practice of positive behaviors including: Reflect on her progress more frequently. For every negative thought, create 2 positives. Follow-up from today's session to interrupt previously held negative assumptions, knowing they are false. Believing more in herself and her ability to make significant changes. Using healthy boundaries with how she spends her time and gets tasks done. Consistent positive self talk. Keeping healthy boundaries with others as needed. Saying no when she needs to say no without guilt. Stop being judgmental with herself. Spending more time off her electronics.  Healthy nutrition and exercise. Making better choices for herself that reflect healthier self-care. Working with automatic thoughts that are negative; change them to be more self accepting. Intentionally look for more positives each day. Get outside and walk some each day as she is able. Practice more proactive behaviors versus reactive behaviors. Letting go of past guilt that holds her back from moving forward. Search for the positives within herself. Decrease overthinking and over analyzing. Staying in the present focusing on what she can change or control. Staying in contact with people who are supportive. Pay close attention to how she views herself, emphasizing her strengths. Recognize the strength she shows working with goal-directed behaviors to move in a direction that supports improved emotional health.  Goal review and progress/challenges noted with patient.  Next appointment within 2 to 3 weeks.  This record has been created using [employer].  Chart creation errors have been sought, but may not always have been located and corrected.  Such creation errors do not reflect on the standard of medical care provided.   Mathis Fare, LCSW

## 2021-09-12 ENCOUNTER — Ambulatory Visit: Admitting: Physician Assistant

## 2021-09-12 ENCOUNTER — Other Ambulatory Visit: Payer: Self-pay

## 2021-09-12 ENCOUNTER — Encounter: Payer: Self-pay | Admitting: Physician Assistant

## 2021-09-12 DIAGNOSIS — F5105 Insomnia due to other mental disorder: Secondary | ICD-10-CM

## 2021-09-12 DIAGNOSIS — F411 Generalized anxiety disorder: Secondary | ICD-10-CM

## 2021-09-12 DIAGNOSIS — F99 Mental disorder, not otherwise specified: Secondary | ICD-10-CM | POA: Diagnosis not present

## 2021-09-12 DIAGNOSIS — F331 Major depressive disorder, recurrent, moderate: Secondary | ICD-10-CM | POA: Diagnosis not present

## 2021-09-12 MED ORDER — HYDROXYZINE HCL 10 MG PO TABS: 10.0000 mg | ORAL_TABLET | Freq: Three times a day (TID) | ORAL | 0 refills | Status: DC | PRN

## 2021-09-12 MED ORDER — FLUVOXAMINE MALEATE 100 MG PO TABS: ORAL_TABLET | ORAL | 1 refills | Status: DC

## 2021-09-12 NOTE — Progress Notes (Signed)
[facility] Med Check  Patient ID: Denise Liu,  MRN: [id number]  PCP: Windy Carina, PA-C (Inactive)  Date of Evaluation: 09/12/2021 Time spent:20 minutes  Chief Complaint:  Chief Complaint   Depression      HISTORY/CURRENT STATUS: HPI For routine f/u. Lost to f/u for over a year.  Feels depressed, low self-worth, doesn't cry easily, feels numb, no emotions, doesn't work and watches 'too much tv' and takes care of her older sister some, no motivation, no energy, is in church choir, [employer], makes excuses to not go places, not sleeping well, has trouble falling asleep and staying asleep. No SI/HI.   Anxiety has gotten worse. Obsesses about things. No PA, just uneasy all the time. Trouble focusing and memory is worsening.   Patient denies increased energy with decreased need for sleep, no increased talkativeness, no racing thoughts, no impulsivity or risky behaviors, no increased spending, no increased libido, no grandiosity, no increased irritability or anger, and no hallucinations.  Denies dizziness, syncope, seizures, numbness, tingling, tremor, tics, unsteady gait, slurred speech, confusion. Denies muscle or joint pain, stiffness, or dystonia.  She is seeing Rockne Menghini, LCSW every 2 weeks.  Therapy is going well and it is very helpful.  Individual Medical History/ Review of Systems: Changes? :No    Past medications for mental health diagnoses include: Topamax for migraines, modafinil, Zoloft, gabapentin she did not like the way it made her feel, Wellbutrin XL,   Allergies: Codeine, Wellbutrin [bupropion], and Topamax [topiramate]  Current Medications:  Current Outpatient Medications:    atorvastatin (LIPITOR) 10 MG tablet, Take 10 mg by mouth daily., Disp: , Rfl:    cyclobenzaprine (FLEXERIL) 10 MG tablet, Take 10 mg by mouth 3 (three) times daily as needed for muscle spasms., Disp: , Rfl:    fluticasone (FLONASE) 50 MCG/ACT nasal spray, 1 spray by Each Nare  route daily., Disp: , Rfl:    fluvoxaMINE (LUVOX) 100 MG tablet, 1 po qhs for 4 nights, then 1.5 pills qhs., Disp: 45 tablet, Rfl: 1   hydrOXYzine (ATARAX) 10 MG tablet, Take 1-2 tablets (10-20 mg total) by mouth 3 (three) times daily as needed., Disp: 60 tablet, Rfl: 0   levothyroxine (SYNTHROID) 25 MCG tablet, Take by mouth., Disp: , Rfl:    lidocaine (LIDODERM) 5 %, Place 1 patch onto the skin daily. Remove & Discard patch within 12 hours or as directed by MD, Disp: , Rfl:    losartan (COZAAR) 25 MG tablet, Take 25 mg by mouth daily., Disp: , Rfl:    naproxen sodium (ANAPROX) 220 MG tablet, Take 220 mg by mouth as needed., Disp: , Rfl:    omeprazole (PRILOSEC) 20 MG capsule, TAKE 1 CAPSULE DAILY, Disp: , Rfl:    sertraline (ZOLOFT) 100 MG tablet, Take 100 mg by mouth daily. , Disp: , Rfl:    b complex vitamins capsule, Take 1 capsule by mouth daily. (Patient not taking: Reported on 09/12/2021), Disp: , Rfl:    calcium carbonate 200 MG capsule, Take 600 mg by mouth daily. (Patient not taking: Reported on 09/12/2021), Disp: , Rfl:    Cholecalciferol (VITAMIN D3) 2000 UNITS TABS, Take 4,000 Int'l Units by mouth. (Patient not taking: Reported on 09/12/2021), Disp: , Rfl:    co-enzyme Q-10 50 MG capsule, Take 300 mg by mouth daily. (Patient not taking: Reported on 09/12/2021), Disp: , Rfl:    Glucosamine-Chondroit-Vit C-Mn (GLUCOSAMINE 1500 COMPLEX PO), Take by mouth. (Patient not taking: Reported on 09/12/2021), Disp: , Rfl:  Magnesium 250 MG TABS, Take by mouth. (Patient not taking: Reported on 09/12/2021), Disp: , Rfl:    MANGANESE PO, Take by mouth. (Patient not taking: Reported on 09/12/2021), Disp: , Rfl:    Misc Natural Products (TART CHERRY ADVANCED PO), Take by mouth. (Patient not taking: Reported on 09/12/2021), Disp: , Rfl:    Multiple Vitamins-Minerals (MULTIVITAMIN & MINERAL PO), Take by mouth. (Patient not taking: Reported on 09/12/2021), Disp: , Rfl:    Omega-3 Fatty Acids (FISH OIL) 1200 MG  CAPS, Take 2,400 mg by mouth. (Patient not taking: Reported on 09/12/2021), Disp: , Rfl:    potassium chloride SA (KLOR-CON) 20 MEQ tablet, Take by mouth. (Patient not taking: Reported on 03/20/2020), Disp: , Rfl:    predniSONE (DELTASONE) 20 MG tablet, 2 tabs po daily x 3 days (Patient not taking: Reported on 03/08/2019), Disp: 6 tablet, Rfl: 0   traMADol (ULTRAM) 50 MG tablet, Take 50 mg by mouth every 6 (six) hours as needed for pain. (Patient not taking: Reported on 03/20/2020), Disp: , Rfl:  Medication Side Effects: none  Family Medical/ Social History: Changes? No  MENTAL HEALTH EXAM:  There were no vitals taken for this visit.There is no height or weight on file to calculate BMI.  General Appearance: Casual, Neat, Well Groomed and Obese  Eye Contact:  Good  Speech:  Clear and Coherent and Normal Rate  Volume:  Normal  Mood:  Depressed  Affect:  Depressed  Thought Process:  Goal Directed and Descriptions of Associations: Intact  Orientation:  Full (Time, Place, and Person)  Thought Content: Logical   Suicidal Thoughts:  No  Homicidal Thoughts:  No  Memory:  WNL  Judgement:  Good  Insight:  Good  Psychomotor Activity:  Normal  Concentration:  Concentration: Good  Recall:  Good  Fund of Knowledge: Good  Language: Good  Assets:  Desire for Improvement  ADL's:  Intact  Cognition: WNL  Prognosis:  Good    DIAGNOSES:    ICD-10-CM   1. Major depressive disorder, recurrent episode, moderate (HCC)  F33.1     2. Generalized anxiety disorder  F41.1     3. Insomnia due to other mental disorder  F51.05    F99        Receiving Psychotherapy: Yes Rockne Menghini, LCSW  RECOMMENDATIONS:  PDMP was reviewed.  No controlled substances since 2021.. I provided 20 minutes of face to face time during this encounter, including time spent before and after the visit in records review, medical decision making, counseling pertinent to today's visit, and charting.  Because she is having  depression, anxiety, and insomnia I recommend changing Zoloft to Luvox.  That can cover all 3 of those problems.  Also recommend hydroxyzine.  We discussed the benefits, risks and side effects of both those drugs and she accepts.  Wean off the Zoloft 100 mg by taking 1/2 pill daily for 4 days and then stop.  At the same time she will start Luvox. Start Luvox 100 mg, 1 p.o. daily for 4 days and then increase to 1.5 pills daily. Start hydroxyzine 10 mg, 1-2 3 times daily as needed anxiety. Continue therapy with Rockne Menghini, LCSW. Return in 6-8 weeks.  Melony Overly, PA-C

## 2021-09-12 NOTE — Patient Instructions (Signed)
On the Zoloft 100 mg, take 1/2 pill daily for 4 days and then stop. At the same time start Luvox 100 mg pill, 1 every night for 4 nights and then increase to 1 1/2 pills.

## 2021-09-19 ENCOUNTER — Ambulatory Visit (INDEPENDENT_AMBULATORY_CARE_PROVIDER_SITE_OTHER): Admitting: Psychiatry

## 2021-09-19 ENCOUNTER — Other Ambulatory Visit: Payer: Self-pay

## 2021-09-19 DIAGNOSIS — F331 Major depressive disorder, recurrent, moderate: Secondary | ICD-10-CM | POA: Diagnosis not present

## 2021-09-19 NOTE — Progress Notes (Signed)
[facility] Counselor/Therapist Progress Note  Patient ID: Denise Liu, MRN: [id number],    Date: 09/19/2021  Time Spent: 50 minutes   Treatment Type: Individual Therapy  Reported Symptoms: anxiety, tired, depression (main symptom more recently)  Mental Status Exam:  Appearance:   Casual     Behavior:  Appropriate, Sharing, and low motivation  Motor:  Normal  Speech/Language:   WNL  Affect:  Depressed and anxious  Mood:  anxious and depressed  Thought process:  goal directed  Thought content:    Overthinking and over-analyzing  Sensory/Perceptual disturbances:    WNL  Orientation:  oriented to person, place, time/date, situation, day of week, month of year, year, and stated date of Jan. 19, 2023  Attention:  Good  Concentration:  Good and Fair  Memory:  WNL  Fund of knowledge:   Good  Insight:    Fair  Judgment:   Good and Fair  Impulse Control:  Good   Risk Assessment: Danger to Self:  No Self-injurious Behavior: No Danger to Others: No Duty to Warn:no Physical Aggression / Violence:No  Access to Firearms a concern: No  Gang Involvement:No   Subjective:  Patient today reporting depression (main symptom more recently), anxiety, tired, some improvement in concentration, not as much self-doubt, negative self talk some better. Denies any SI. Asked her about having a sense of purpose and she seemed to connect with that and admits that "I'm not feeling much of a purpose. Discussed this today and together came up with some options for patient to get out of the house and around other people. Shares that she will sometimes have "days like this when I just want to be alone and not do a lot."  Adds that it has been short-lived. Planning activities into the next few weeks, not isolating as much, getting out more, be around more people she knows.  Processed a little more today of her long-held feelings that others do not like her, except her and feel negatively towards her.   Plan to pick up on this next session and allow more time.  Interventions: Solution-Oriented/Positive Psychology, Ego-Supportive, and Insight-Oriented  Treatment Goal plan: Patient not signing Tx Plan on computer screen due to Covid. Treatment Goals: Goals remain on Tx Plan as patient works with strategies to achieve her goals.  Progress will be documented each session under "Progress" section of Plan.  Long Term goal: Develop healthy cognitive patterns and beliefs about self and the world that lead to alleviation and help to prevent relapse of depression or anxiety. Short Term goal: Identify and replace depressive/anxious thinking that leads to depressive/anxious feelings and actions.Elevate her motivation and self-esteem.  Strategy: Educate the patient about cognitive restructuring including self-monitoring of automatic thoughts reflecting depressive/negative, and anxious beliefs   Diagnosis:   ICD-10-CM   1. Major depressive disorder, recurrent episode, moderate (HCC)  F33.1      Plan: Patient today not as motivated and has been struggling with "not having the drive" to get out much or be involved with other people.  She describes these last few days as being isolated from others and feeling more down and negative about herself.  Processed this with her and how this type of thing has happened before and she had similar feelings.  Commits to making some changes for this upcoming week and notice if that changes her outlook and attitude.  Has some history of struggling in interpersonal relationships and also has some history of isolating herself at  times and that she usually not a good choice, which we discussed more at length today.  Is to have some interactions with others over this weekend and also just get out some on her own, allowing herself to have 1 day during the the week that she can spend more time at home if that is her desire.  She commits to this and feels better about it since she  still has her "1 day off" and she does not feel that will have a negative effect on her.  To talk more next session about some of her tendency at times to isolate and how that is not very helpful for her.  Encouraged patient in her practice of more positive behaviors including:  Reflect on any progress often. For every negative thought create 2 positives. Follow-up from today's session to interrupt negative assumptions, knowing they are false. Believing more in herself and her ability to make significant changes. Using healthy boundaries with how she spends her time and gets tasks done. Consistent positive self talk. Keep healthy boundaries with others as needed. Saying no when she needs to say no without guilt. Stop being so judgmental with herself. Spend more time off of her electronics. Healthy nutrition and exercise. Making better choices for herself that reflect healthier self-care. Working with automatic thoughts that are negative and change them to be more self accepting. Intentionally look for more positives each day. Get outside and walk some each day as she is able. Practice more proactive behaviors versus reactive behaviors. Let go of past guilt that holds her back from moving forward. Search for the positives within herself and be able to list them. Decrease overthinking and over analyzing. Staying in the present focusing on what she can control or change. Stay in contact with people who are supportive. Pay close attention to how she views herself, emphasizing her strengths. Realize the strength she shows working with goal-directed behaviors to move in a direction that supports improved emotional health.   Goal review and progress/challenges noted with patient.  Next appointment within 2 weeks.  This record has been created using [employer].  Chart creation errors have been sought, but may not always have been located and corrected.  Such creation errors do not reflect  on the standard of medical care provided.  Mathis Fare, LCSW

## 2021-10-03 ENCOUNTER — Other Ambulatory Visit: Payer: Self-pay

## 2021-10-03 ENCOUNTER — Ambulatory Visit (INDEPENDENT_AMBULATORY_CARE_PROVIDER_SITE_OTHER): Admitting: Psychiatry

## 2021-10-03 DIAGNOSIS — F411 Generalized anxiety disorder: Secondary | ICD-10-CM

## 2021-10-03 NOTE — Progress Notes (Signed)
[facility] Counselor/Therapist Progress Note  Patient ID: Denise Liu, MRN: [id number],    Date: 10/03/2021  Time Spent: 50 minutes   Treatment Type: Individual Therapy  Reported Symptoms: anxiety, depression "improved, decreased sleep past couple weeks and feeling more tired  Mental Status Exam:  Appearance:   Casual     Behavior:  Appropriate, Sharing, and Motivated  Motor:  Normal  Speech/Language:   Clear and Coherent  Affect:  Depressed and anxious  Mood:  anxious and depressed  Thought process:  goal directed  Thought content:    Overthinking and some obsessiveness  Sensory/Perceptual disturbances:    WNL  Orientation:  oriented to person, place, time/date, situation, day of week, month of year, year, and stated date of Feb. 2, 2023  Attention:  Fair  Concentration:  Fair  Memory:  WNL  Fund of knowledge:   Good  Insight:    Good  Judgment:   Good  Impulse Control:  Good and Fair   Risk Assessment: Danger to Self:  No Self-injurious Behavior: No Danger to Others: No Duty to Warn:no Physical Aggression / Violence:No  Access to Firearms a concern: No  Gang Involvement:No   Subjective: Patient today reporting anxiety, depression (improving), tired,self-doubt (improving), decreased negative self-talk,and concentration is some better. Denies any SI.Feels a sense of purpose but it is impacted by her "tiredness and less sleep." Did follow up from last session and has been getting out of the house more, "almost daily", which is helping. Wakes up during night and having hard time getting back to sleep. Plans to check with her doctor re: sleep issues.  Also feels her difficulty sleeping may relate to her "allergy issues" that have been bothering past week.  Processed more today of her long-term feelings that "others do not like me" and also her tendency to be negative towards her own self.  Has had some outings recently with friends that has been helpful in her mood.  Self-talk improving.  Needs to set limits for herself. Agrees "to keep getting out of my house often, go to bed earlier in order to get more sleep, stay connected with friends, letting go of guilt that holds her back, and setting healthy limits/boundaries with sister. To focus on these between sessions and will follow up next visit.   Interventions: Solution-Oriented/Positive Psychology, Ego-Supportive, and Insight-Oriented  Treatment Goal plan: Patient not signing Tx Plan on computer screen due to Covid. Treatment Goals: Goals remain on Tx Plan as patient works with strategies to achieve her goals.  Progress will be documented each session under "Progress" section of Plan.  Long Term goal: Develop healthy cognitive patterns and beliefs about self and the world that lead to alleviation and help to prevent relapse of depression or anxiety. Short Term goal: Identify and replace depressive/anxious thinking that leads to depressive/anxious feelings and actions.Elevate her motivation and self-esteem.  Strategy: Educate the patient about cognitive restructuring including self-monitoring of automatic thoughts reflecting depressive/negative, and anxious beliefs   Diagnosis:   ICD-10-CM   1. Generalized anxiety disorder  F41.1      Plan: Patient today showing motivation and and did some good work on some self negating and negative assumptions which she is trying to stop, as she recognizes this is connected with her anxiety and depression.  Motivation is better today but has had some difficulty sleeping recently and plans to check with her doctor on that.  Has done much better about getting out of the house frequently  and has spent some time with friends from a group at church.  Did get her sister moved to another long-term facility and that seems to be working out more successfully.  Trying to set better limits with sister so that patient does have some time just for herself uninterrupted.  Looking to  have better boundaries.  Trying to not assume what might go wrong versus right.  Encouraged patient in her practice of more positive behaviors including: Recognizing her progress and reflecting on it more often, allow for healthier sleep patterns, for every negative thought create 2 positives, follow-up from today's session and practice interrupting negative assumptions knowing they are false, believing more in herself and her ability to make significant changes, use healthy boundaries with how she spends her time and gets tasks done, consistent positive self talk, keep healthy boundaries with others as needed, saying no when she needs to say no without guilt, stop being so judgmental of herself, spend more time off her electronics, healthy nutrition and exercise, making better choices for herself that reflect healthier self-care, intentionally looking for more positives each day, working with automatic thoughts that are negative and change them to be more self accepting, intentionally look for more positives each day, get outside and walk as she is able daily, practice more proactive behaviors versus reactive behaviors, letting go of past guilt that holds her back from moving forward, search for the positives within herself and be able to name them, decrease overthinking and over analyzing, staying in the present focusing on what she can control or change, stay in contact with people who are supportive, pay close attention to how she views herself emphasizing her strengths, and recognize the strengths she shows working with goal-directed behaviors to move in a direction that supports improved emotional health.  Goal review and progress/challenges noted with patient.  Next appointment within 2 to 3 weeks.  This record has been created using [employer].  Chart creation errors have been sought, but may not always have been located and corrected.  Such creation errors do not reflect on the standard of  medical care provided.  Mathis Fare, LCSW

## 2021-10-17 ENCOUNTER — Other Ambulatory Visit: Payer: Self-pay

## 2021-10-17 ENCOUNTER — Ambulatory Visit (INDEPENDENT_AMBULATORY_CARE_PROVIDER_SITE_OTHER): Admitting: Psychiatry

## 2021-10-17 DIAGNOSIS — F411 Generalized anxiety disorder: Secondary | ICD-10-CM | POA: Diagnosis not present

## 2021-10-17 NOTE — Progress Notes (Signed)
[facility] Counselor/Therapist Progress Note  Patient ID: Denise Liu, MRN: [id number],    Date: 10/17/2021  Time Spent: 55 minutes   Treatment Type: Individual Therapy  Reported Symptoms: anxiety, "still struggling some with follow through on goal but is making small gains", some depression but is making some progress   Mental Status Exam:  Appearance:   Casual     Behavior:  Appropriate, Sharing, and Motivated  Motor:  Normal  Speech/Language:   Clear and Coherent  Affect:  Depressed and anxiety  Mood:  anxious and depressed  Thought process:  goal directed  Thought content:    overthinking  Sensory/Perceptual disturbances:    WNL  Orientation:  oriented to person, place, time/date, situation, day of week, month of year, year, and stated date of Feb. 16, 2023  Attention:  Good  Concentration:  Fair  Memory:  WNL  Fund of knowledge:   Good  Insight:    Good  Judgment:   Good  Impulse Control:  Fair and less impulse control with eating   Risk Assessment: Danger to Self:  No Self-injurious Behavior: No Danger to Others: No Duty to Warn:no Physical Aggression / Violence:No  Access to Firearms a concern: No  Gang Involvement:No   Subjective:  In today reporting depression (improving), anxiety, and long term self esteem issues more recently brought up in sessions. Not sticking with her intentions of eating healthier and losing weight. States she still has her meal plan with that weight loss program and plans to get back on it. Following up from last session, we had agreed to talk further about her "long-held feelings that others do not like her, do not accept her, and feel negatively towards her." Patient able to pursue this more today in our discussion and she shares how these thoughts have troubled her for a lot of yrs. Gave examples of the types of negative thoughts she feels others have of her and processed them to realize how she was making some broad assumptions  that were not likely true and based more on her tendency to often look in negative direction.  Discussed how she can work on her thoughts/assumptions and how to interrupt negative patterns and replace with more positive/encouraging patterns.Looked at some behaviors she can work on to feel more a part of things when she attends group events, and also in daily interpersonal relationships. Energy better today, concentration, self-doubt improving, and efforts made to improve her self talk to be more positive. Also followed up today on her need for "a sense of purpose", and reports she has followed through on some insights from last session especially in getting out of the house more and not isolating as much as she had been doing, and is to continue between sessions.   Interventions: Solution-Oriented/Positive Psychology and Insight-Oriented  Treatment Goal plan: Patient not signing Tx Plan on computer screen due to Covid. Treatment Goals: Goals remain on Tx Plan as patient works with strategies to achieve her goals.  Progress will be documented each session under "Progress" section of Plan.  Long Term goal: Develop healthy cognitive patterns and beliefs about self and the world that lead to alleviation and help to prevent relapse of depression or anxiety. Short Term goal: Identify and replace depressive/anxious thinking that leads to depressive/anxious feelings and actions.Elevate her motivation and self-esteem.  Strategy: Educate the patient about cognitive restructuring including self-monitoring of automatic thoughts reflecting depressive/negative, and anxious beliefs   Diagnosis:   ICD-10-CM  1. Generalized anxiety disorder  F41.1      Plan: Patient today showing good motivation and follow through and working on her depression which is improving, anxiety, and some long-term self-esteem issues that relate to how she feels others do not like her nor accept her.  We discussed this in session today  and how some of her life experiences have led her to feel that way over the years, and then also compared them with some more current life experiences "that do not reflect others not liking her", and in fact the more current life experiences more often reflect people having positive feelings for patient and enjoying being around her especially within her church and community. Encouraged and reminded patient in her practice of positive behaviors including:   Recognize and reflect on her progress every 1-2 days. For every negative thought create 2 positives. Keep healthy boundaries with others as needed. Follow-up from sessions to interrupt negative assumptions, knowing they are false. Consistent positive self talk. Saying no when she needs to say no, without guilt. Stop being so judgmental with herself. Spend more time off of her electronics each day. Healthy nutrition and exercise. Making better choices for herself that reflect healthier self-care. Working with negative automatic thoughts and change them to be more self accepting. Intentionally look for more positives each day. Practice more proactive behaviors versus reactive behaviors. Let go of past guilt that holds her back from moving forward. Search for the positives within herself and be able to name them. Decrease overthinking and over analyzing. Staying in the present focusing on what she can control or change. Staying in contact with people who are supportive. Pay close attention to how she views herself, emphasizing her strengths. Recognize the strength she shows working with goal-directed behaviors to move in a direction that supports her improved emotional health and overall wellbeing.  Goal review and progress/challenges noted with patient.  Next appointment within 2 to 3 weeks.  This record has been created using [employer].  Chart creation errors have been sought, but may not always have been located and corrected.   Such creation errors do not reflect on the standard of medical care provided.   Mathis Fare, LCSW

## 2021-11-07 ENCOUNTER — Ambulatory Visit: Admitting: Physician Assistant

## 2021-11-07 ENCOUNTER — Other Ambulatory Visit: Payer: Self-pay

## 2021-11-07 ENCOUNTER — Ambulatory Visit: Admitting: Psychiatry

## 2021-11-07 DIAGNOSIS — F411 Generalized anxiety disorder: Secondary | ICD-10-CM

## 2021-11-07 NOTE — Progress Notes (Signed)
?    [facility] Counselor/Therapist Progress Note ? ?Patient ID: Denise Liu, MRN: [id number],   ? ?Date: 11/07/2021 ? ?Time Spent: 55 minutes  ? ?Treatment Type: Individual Therapy ? ?Reported Symptoms: anxiety, depression (improving) ? ?Mental Status Exam: ? ?Appearance:   Casual     ?Behavior:  Appropriate and Sharing  ?Motor:  Normal  ?Speech/Language:   Clear and Coherent  ?Affect:  Anxious, depressed (better)  ?Mood:  anxious and depressed  ?Thought process:  overthinking  ?Thought content:    overthinking  ?Sensory/Perceptual disturbances:    WNL  ?Orientation:  oriented to person, place, time/date, situation, day of week, month of year, year, and stated date of November 07, 2021  ?Attention:  Good  ?Concentration:  Good and Fair  ?Memory:  WNL  ?Fund of knowledge:   Good  ?Insight:    Good and Fair  ?Judgment:   Good  ?Impulse Control:  Good and Fair  ? ?Risk Assessment: ?Danger to Self:  No ?Self-injurious Behavior: No ?Danger to Others: No ?Duty to Warn:no ?Physical Aggression / Violence:No  ?Access to Firearms a concern: No  ?Gang Involvement:No  ? ?Subjective: Patient in today reporting anxiety, and some depression which has decreased. Self-esteem issues "about the same" but later states "it is a little better and I haven't felt any of the spiraling downward recently." Tired and motivation is lower in not following through on my targeted behaviors, for example, walking." States I do feel better after I've gotten out and done something. Discussed ways of better motivating herself, what doesn't work for her and what might work.  Looked at negative and self defeating/self doubting types of thinking.  Rationalizing, postponing, "hard to establish herself," finally admits "its just hard to commit to changing behaviors".  Discussed this more in session and patient is to follow through with a homework assignment in reference to how she feels blocked at times from making progress and what she feels contributes  to that, as well as what she feels can help that more effectively. ? ?Interventions: Cognitive Behavioral Therapy and Solution-Oriented/Positive Psychology ? ?Treatment Goal plan: ?Patient not signing Tx Plan on computer screen due to Covid. ?Treatment Goals: ?Goals remain on Tx Plan as patient works with strategies to achieve her goals.  Progress will be documented each session under "Progress" section of Plan.  ?Long Term goal: ?Develop healthy cognitive patterns and beliefs about self and the world that lead to alleviation and help to prevent relapse of depression or anxiety. ?Short Term goal: ?Identify and replace depressive/anxious thinking that leads to depressive/anxious feelings and actions.Elevate her motivation and self-esteem.  ?Strategy: ?Educate the patient about cognitive restructuring including self-monitoring of automatic thoughts reflecting depressive/negative, and anxious beliefs ?  ?Diagnosis: ?  ICD-10-CM   ?1. Generalized anxiety disorder  F41.1   ?  ? ?Plan: Patient today showing motivation and follow-through as she worked further on her anxiety and depression which are improving some however today feeling less motivated to work on some of the personal care issues discussed at last session.  States she does have a "doctor's appointment next week to make sure there is nothing physical going on that is limiting her motivation" to follow up on her previously set goals of better self-care.  Encouraged to continue her involvement with church and community as much as she is able as that does seem to be helpful to her and something that she enjoys. ?Encouraged patient in the practice of positive behaviors including: Recognize and reflect on  progress daily, for every negative thought create 2 positives, healthy boundaries with others, follow-up from sessions to interrupt negative assumptions, consistent positive self talk, saying no when she needs to say no, stop being judgmental with herself, spend  more time off her electronics daily, healthy nutrition and exercise, making better choices for herself that reflect healthier self-care, working with negative automatic thoughts and changing them to be more self accepting, intentionally look for more positives daily, practice more proactive behaviors versus reactive behaviors, let go of past guilt that holds her back from moving forward, decrease overthinking and over analyzing, stay in the present focusing what she can control, staying in contact with people who are supportive, pay close attention to how she views herself and emphasize her strengths, realize the strengths she shows working with goal-directed behaviors to move in a direction that supports her improved emotional health. ? ? ?Goal review and progress/challenges noted with patient. ? ?Next appointment within 2 to 3 weeks. ? ?This record has been created using [employer].  Chart creation errors have been sought, but may not always have been located and corrected.  Such creation errors do not reflect on the standard of medical care provided. ? ? ?Mathis Fare, LCSW ? ? ? ? ? ? ? ? ? ? ? ? ? ? ? ? ? ? ?

## 2021-11-21 ENCOUNTER — Other Ambulatory Visit: Payer: Self-pay

## 2021-11-21 ENCOUNTER — Encounter: Payer: Self-pay | Admitting: Physician Assistant

## 2021-11-21 ENCOUNTER — Ambulatory Visit: Admitting: Psychiatry

## 2021-11-21 ENCOUNTER — Ambulatory Visit: Admitting: Physician Assistant

## 2021-11-21 DIAGNOSIS — F411 Generalized anxiety disorder: Secondary | ICD-10-CM

## 2021-11-21 DIAGNOSIS — F331 Major depressive disorder, recurrent, moderate: Secondary | ICD-10-CM

## 2021-11-21 DIAGNOSIS — F99 Mental disorder, not otherwise specified: Secondary | ICD-10-CM

## 2021-11-21 DIAGNOSIS — F5105 Insomnia due to other mental disorder: Secondary | ICD-10-CM | POA: Diagnosis not present

## 2021-11-21 MED ORDER — FLUVOXAMINE MALEATE 100 MG PO TABS: 200.0000 mg | ORAL_TABLET | Freq: Every day | ORAL | 1 refills | Status: DC

## 2021-11-21 NOTE — Progress Notes (Signed)
[facility] Med Check ? ?Patient ID: Denise Liu,  ?MRN: [id number] ? ?PCP: Windy Carina, PA-C (Inactive) ? ?Date of Evaluation: 11/21/2021 ?Time spent:20 minutes ? ?Chief Complaint:  ?Chief Complaint   ?Anxiety; Depression; Follow-up ?  ? ? ?HISTORY/CURRENT STATUS: ?HPI For routine f/u.  ? ?Changed Zoloft to Luvox at LOV. Is about 25% better. Still very tired. No energy or motivation, personal hygiene is a little less, sometimes goes a day without of a shower. Appetite is lower but craves sugar. Does eat real food though. Sleeps a lot. Still doesn't feel rested. Doesn't snore. Isolates, nervous around crowds.  No panic attacks.  Does not do much around the house at all.  Her husband gets the groceries and things like that.  No SI/HI.  ? ?Patient denies increased energy with decreased need for sleep, no increased talkativeness, no racing thoughts, no impulsivity or risky behaviors, no increased spending, no increased libido, no grandiosity, no increased irritability or anger, no paranoia, and no hallucinations. ? ?Denies dizziness, syncope, seizures, numbness, tingling, tremor, tics, unsteady gait, slurred speech, confusion. Denies muscle or joint pain, stiffness, or dystonia. ? ?She is seeing Rockne Menghini, LCSW every 2 weeks.  Therapy is going well and it is very helpful. ? ?Individual Medical History/ Review of Systems: Changes? :Yes    dx w/ DM.  Treating with diet so far. ? ?Past medications for mental health diagnoses include: ?Topamax for migraines, modafinil, Zoloft, gabapentin she did not like the way it made her feel, Wellbutrin XL,  ? ?Allergies: Codeine, Wellbutrin [bupropion], and Topamax [topiramate] ? ?Current Medications:  ?Current Outpatient Medications:  ?  atorvastatin (LIPITOR) 10 MG tablet, Take 10 mg by mouth daily., Disp: , Rfl:  ?  cyclobenzaprine (FLEXERIL) 10 MG tablet, Take 10 mg by mouth 3 (three) times daily as needed for muscle spasms., Disp: , Rfl:  ?  fluticasone (FLONASE) 50  MCG/ACT nasal spray, 1 spray by Each Nare route daily., Disp: , Rfl:  ?  hydrOXYzine (ATARAX) 10 MG tablet, Take 1-2 tablets (10-20 mg total) by mouth 3 (three) times daily as needed., Disp: 60 tablet, Rfl: 0 ?  levothyroxine (SYNTHROID) 25 MCG tablet, Take by mouth., Disp: , Rfl:  ?  lidocaine (LIDODERM) 5 %, Place 1 patch onto the skin daily. Remove & Discard patch within 12 hours or as directed by MD, Disp: , Rfl:  ?  losartan (COZAAR) 25 MG tablet, Take 25 mg by mouth daily., Disp: , Rfl:  ?  omeprazole (PRILOSEC) 20 MG capsule, TAKE 1 CAPSULE DAILY, Disp: , Rfl:  ?  b complex vitamins capsule, Take 1 capsule by mouth daily. (Patient not taking: Reported on 09/12/2021), Disp: , Rfl:  ?  calcium carbonate 200 MG capsule, Take 600 mg by mouth daily. (Patient not taking: Reported on 09/12/2021), Disp: , Rfl:  ?  Cholecalciferol (VITAMIN D3) 2000 UNITS TABS, Take 4,000 Int'l Units by mouth. (Patient not taking: Reported on 09/12/2021), Disp: , Rfl:  ?  co-enzyme Q-10 50 MG capsule, Take 300 mg by mouth daily. (Patient not taking: Reported on 09/12/2021), Disp: , Rfl:  ?  fluvoxaMINE (LUVOX) 100 MG tablet, Take 2 tablets (200 mg total) by mouth at bedtime., Disp: 180 tablet, Rfl: 1 ?  Glucosamine-Chondroit-Vit C-Mn (GLUCOSAMINE 1500 COMPLEX PO), Take by mouth. (Patient not taking: Reported on 09/12/2021), Disp: , Rfl:  ?  Magnesium 250 MG TABS, Take by mouth. (Patient not taking: Reported on 09/12/2021), Disp: , Rfl:  ?  MANGANESE PO, Take by  mouth. (Patient not taking: Reported on 09/12/2021), Disp: , Rfl:  ?  Misc Natural Products (TART CHERRY ADVANCED PO), Take by mouth. (Patient not taking: Reported on 09/12/2021), Disp: , Rfl:  ?  Multiple Vitamins-Minerals (MULTIVITAMIN & MINERAL PO), Take by mouth. (Patient not taking: Reported on 09/12/2021), Disp: , Rfl:  ?  naproxen sodium (ANAPROX) 220 MG tablet, Take 220 mg by mouth as needed., Disp: , Rfl:  ?  potassium chloride SA (KLOR-CON) 20 MEQ tablet, Take by mouth.  (Patient not taking: Reported on 03/20/2020), Disp: , Rfl:  ?  traMADol (ULTRAM) 50 MG tablet, Take 50 mg by mouth every 6 (six) hours as needed for pain. (Patient not taking: Reported on 03/20/2020), Disp: , Rfl:  ?Medication Side Effects: none ? ?Family Medical/ Social History: Changes? No ? ?MENTAL HEALTH EXAM: ? ?There were no vitals taken for this visit.There is no height or weight on file to calculate BMI.  ?General Appearance: Casual, Neat, Well Groomed and Obese  ?Eye Contact:  Good  ?Speech:  Clear and Coherent and Normal Rate  ?Volume:  Normal  ?Mood:  Euthymic  ?Affect:  Anxious  ?Thought Process:  Goal Directed and Descriptions of Associations: Intact  ?Orientation:  Full (Time, Place, and Person)  ?Thought Content: Logical   ?Suicidal Thoughts:  No  ?Homicidal Thoughts:  No  ?Memory:  WNL  ?Judgement:  Good  ?Insight:  Good  ?Psychomotor Activity:  Normal  ?Concentration:  Concentration: Good  ?Recall:  Good  ?Fund of Knowledge: Good  ?Language: Good  ?Assets:  Desire for Improvement  ?ADL's:  Intact  ?Cognition: WNL  ?Prognosis:  Good  ? ?Labs 11/14/2021 reviewed. ? ?DIAGNOSES:  ?  ICD-10-CM   ?1. Major depressive disorder, recurrent episode, moderate (HCC)  F33.1   ?  ?2. Generalized anxiety disorder  F41.1   ?  ?3. Insomnia due to other mental disorder  F51.05   ? F99   ?  ? ? ?Receiving Psychotherapy: Yes   Rockne Menghini, LCSW ? ?RECOMMENDATIONS:  ?PDMP was reviewed.  No controlled substances since 2021. ?I provided 20 minutes of face to face time during this encounter, including time spent before and after the visit in records review, medical decision making, counseling pertinent to today's visit, and charting.  ?Recommend increasing Luvox.  She would like to try it.  No other changes will be made. ? ?Increase Luvox 100 mg, to 2 po qhs.  ?Continue hydroxyzine 10 mg, 1-2 3 times daily as needed anxiety. ?Continue therapy with Rockne Menghini, LCSW. ?Return in 6 weeks. ? ?Melony Overly, PA-C  ?

## 2021-11-21 NOTE — Progress Notes (Signed)
?    [facility] Counselor/Therapist Progress Note ? ?Patient ID: Denise Liu, MRN: [id number],   ? ?Date: 11/21/2021 ? ?Time Spent: 55 minutes  ? ?Treatment Type: Individual Therapy ? ?Reported Symptoms: anxiety, depression, loss of dog this wk (66 yrs old), found out that "I have early diabetes" ? ?Mental Status Exam: ? ?Appearance:   Casual     ?Behavior:  Appropriate, Sharing, and Motivated  ?Motor:  Normal  ?Speech/Language:   Clear and Coherent  ?Affect:  Depressed and anxious  ?Mood:  anxious and depressed  ?Thought process:  flight of ideas  ?Thought content:    overthinking  ?Sensory/Perceptual disturbances:    WNL  ?Orientation:  oriented to person, place, time/date, situation, day of week, month of year, year, and stated date of November 21, 2021.  ?Attention:  Fair  ?Concentration:  Fair  ?Memory:  WNL  ?Fund of knowledge:   Good  ?Insight:    Good and Fair  ?Judgment:   Good  ?Impulse Control:  Fair  ? ?Risk Assessment: ?Danger to Self:  No ?Self-injurious Behavior: No ?Danger to Others: No ?Duty to Warn:no ?Physical Aggression / Violence:No  ?Access to Firearms a concern: No  ?Gang Involvement:No  ? ?Subjective:  Patient in today reporting anxiety and depression. Very sad as her dog of 14 yrs died this week and discussed this more in detail.Also found out "I'm early diabetes but we are going to try and treat it initially with diet and exercise. Concerned about the diabetes and "I have actually wondered about it and it's a relief to know what's wrong and why I'm so tired." Procrastinating. "I'm just having a lazy day today, I was better yesterday." Hard to motivate herself, "and I have days like this at other times and they usually pass." Talked further and ended up on issues of lacking "self love" which she seemed to relate to strongly, including how she holds herself back from experiencing more happiness and withholding her love for herself .  Looked at some ways to interrupt those thoughts when  they happen, as they are not constant, and be able to experience self-love without judging herself.  Looked at some of her negative and self-defeating/self doubting types of thinking that tend to re-occur. Acknowledges that she spends too much time watching tv. Suggested she cut back on her tv time and have more time for other people and other interests including her family.  She did agree to this behavior change and will report more on this next session as we ran out of time today.  She denied that her feelings about this had anything to do with the loss of her dog earlier this week.  Suggested some homework to her related to her difficulty in self-love. ? ?Interventions: Cognitive Behavioral Therapy and Solution-Oriented/Positive Psychology ? ?Treatment Goal plan: ?Patient not signing Tx Plan on computer screen due to Covid. ?Treatment Goals: ?Goals remain on Tx Plan as patient works with strategies to achieve her goals.  Progress will be documented each session under "Progress" section of Plan.  ?Long Term goal: ?Develop healthy cognitive patterns and beliefs about self and the world that lead to alleviation and help to prevent relapse of depression or anxiety. ?Short Term goal: ?Identify and replace depressive/anxious thinking that leads to depressive/anxious feelings and actions.Elevate her motivation and self-esteem.  ?Strategy: ?Educate the patient about cognitive restructuring including self-monitoring of automatic thoughts reflecting depressive/negative, and anxious beliefs ?  ?Diagnosis: ?  ICD-10-CM   ?1. Major depressive disorder,  recurrent episode, moderate (HCC)  F33.1   ?  ? ?Plan:  Patient today showing motivation and some participation in session as she continued working on her anxiety and depression.  Did have a doctor's appointment recently and found out she was "early diabetes".  States that they are not going to use medication right now but going to treat it with diet and exercise.  Does feel  that that is part of the reason she has been feeling so tired.  Not feeling as motivated today.  As noted above, she had a sad week as her 38 year old dog died of illness.  But she stated very clearly that she did not think that was affecting her depression level today and was not sure what was.  Talked at length about a lack of self love which is not constant but as she describes it comes up occasionally.  Did well in talking about this more today and was offered some homework that I felt like would help her focus some on it in more helpful ways between sessions.  Did encourage patient to reach out to family and friends and also be involved in her normal church activities this week as she enjoys the music program there and being a part of it. Encouraged patient in her practice of more positive behaviors including: Healthy boundaries with others, interrupt negative assumptions, recognize and reflect on progress daily, for every negative thought create a positive, consistent positive self talk, saying no when she needs to say no, stop being judgmental with herself, spend more time off her electronics daily, healthy nutrition and exercise, making better choices for herself that reflect healthier self-care, working with negative automatic thoughts and changing them to be more self accepting, intentionally look for more positives daily, practice more proactive behaviors versus reactive behaviors as discussed in session, let go of past guilt that holds her back from moving forward, decrease overthinking and over analyzing, stay in the present focusing on what she can control, remain in contact with people who are supportive, pay close attention to how she views herself and emphasize her strengths, recognize the strength she shows working with goal-directed behaviors to move in a direction that supports her improved emotional health and overall wellbeing. ? ?Goal review and progress/challenges noted with patient. ? ?Next  appointment within 2 to 3 weeks. ? ?This record has been created using [employer].  Chart creation errors have been sought, but may not always have been located and corrected.  Such creation errors do not reflect on the standard of medical care provided. ? ? ?Shanon Ace, LCSW ? ? ? ? ? ? ? ? ? ? ? ? ? ? ? ? ? ? ?

## 2021-12-04 ENCOUNTER — Ambulatory Visit: Admitting: Psychiatry

## 2021-12-04 DIAGNOSIS — F331 Major depressive disorder, recurrent, moderate: Secondary | ICD-10-CM | POA: Diagnosis not present

## 2021-12-04 NOTE — Progress Notes (Signed)
?    [facility] Counselor/Therapist Progress Note ? ?Patient ID: Denise Liu, MRN: [id number],   ? ?Date: 12/04/2021 ? ?Time Spent: 55 minutes  ? ?Treatment Type: Individual Therapy ? ?Reported Symptoms: depression (improving), anxiety, indifferent at times, some less motivation but improving some now with depression decreasing ? ?Mental Status Exam: ? ?Appearance:   Casual     ?Behavior:  Appropriate and Sharing  ?Motor:  Normal  ?Speech/Language:   Clear and Coherent  ?Affect:  Depressed, anxiety  ?Mood:  anxious, depressed  ?Thought process:  goal directed  ?Thought content:    WNL  ?Sensory/Perceptual disturbances:    WNL  ?Orientation:  oriented to person, place, time/date, situation, day of week, month of year, year, and stated date of April, 5, 2023  ?Attention:  Good  ?Concentration:  Good and Fair  ?Memory:  WNL  ?Fund of knowledge:   Good  ?Insight:    Good  ?Judgment:   Good  ?Impulse Control:  Fair   ? ?Risk Assessment: ?Danger to Self:  No ?Self-injurious Behavior: No ?Danger to Others: No ?Duty to Warn:no ?Physical Aggression / Violence:No  ?Access to Firearms a concern: No  ?Gang Involvement:No  ? ?Subjective:  Patient in today reporting depression and anxiety "but depression is improving." * Feels the increase in Luvox is helping. Guilt over less time given to sister living in retirement community. Looking forward to family trip to [place] later this week. Improving in her motivation and hoping it will continue to improve. Trying to move forward through her grief over loss of dog of 14 yrs. Had been making poor choices in food and trying to make more positive choices based on her "pre-diabetic condition". Procrastination is "getting some better gradually." Reports some improvement in her self-love, "although still hold myself back some. " States "I'm no longer real harsh on myself nor with-holding love for myself." Making significant progress and concerned and wanting to hold onto her gains.  Discussed ways to strengthen the newer behaviors she is working on so as to hold onto her gains, and encouraged to be looking more for things to go "right versus wrong."   ? ?Interventions: Cognitive Behavioral Therapy, Ego-Supportive, and Insight-Oriented ?  ?Treatment Goal plan: ?Patient not signing Tx Plan on computer screen due to Covid. ?Treatment Goals: ?Goals remain on Tx Plan as patient works with strategies to achieve her goals.  Progress will be documented each session under "Progress" section of Plan.  ?Long Term goal: ?Develop healthy cognitive patterns and beliefs about self and the world that lead to alleviation and help to prevent relapse of depression or anxiety. ?Short Term goal: ?Identify and replace depressive/anxious thinking that leads to depressive/anxious feelings and actions.Elevate her motivation and self-esteem.  ?Strategy: ?Educate the patient about cognitive restructuring including self-monitoring of automatic thoughts reflecting depressive/negative, and anxious beliefs ?  ? ?Diagnosis: ?  ICD-10-CM   ?1. Major depressive disorder, recurrent episode, moderate (HCC)  F33.1   ?  ? ?Plan:   Patient today showing some motivation and good participation in session as she continued working on her depression and anxiety. Continues watching her diet better re: "early diabetes" and "I  feel better when I eat better."  Is showing more noticeable progress this session and encouraged patient in some of the changes she is making and trying to make. Encouraged patient in her practicing of more positive behaviors including: Interrupting her negative assumptions, healthy boundaries with others, recognizing and reflecting on progress daily, consistent positive self talk,  saying no when she needs to say no, stop being judgmental with herself, spend more time off her electronics daily, healthy nutrition and exercise, making better choices for herself that reflect healthier self-care, working with negative  automatic thoughts and changing them to be more self accepting, intentionally looking for more positives daily, practice more proactive behaviors versus reactive behaviors as discussed in recent sessions, let go of past guilt that holds her back from moving forward, decrease overthinking and over analyzing, stay in the present focusing on what she can control, remain in contact with people who are supportive, pay close attention to how she views herself and emphasize her strengths and positives, and feel good about the strength she shows in working with goal-directed behaviors to move in a direction that supports her improved emotional health and confidence. ? ?Goal review and progress/challenges noted with patient. ? ?Next appointment within 3 weeks. ? ?This record has been created using [employer].  Chart creation errors have been sought, but may not always have been located and corrected.  Such creation errors do not reflect on the standard of medical care provided. ? ? ?Mathis Fare, LCSW ? ? ? ? ? ? ? ? ? ? ? ? ? ? ? ? ? ? ?

## 2021-12-19 ENCOUNTER — Ambulatory Visit: Admitting: Psychiatry

## 2021-12-19 DIAGNOSIS — F411 Generalized anxiety disorder: Secondary | ICD-10-CM

## 2021-12-19 NOTE — Progress Notes (Signed)
?    [facility] Counselor/Therapist Progress Note ? ?Patient ID: Denise Liu, MRN: [id number],   ? ?Date: 12/19/2021 ? ?Time Spent: 55 minutes  ? ?Treatment Type: Individual Therapy ? ?Reported Symptoms: anxious, some difficulty motivating,  some depression ? ?Mental Status Exam: ? ?Appearance:   Casual     ?Behavior:  Appropriate, Sharing, and some less motivation  ?Motor:  Normal  ?Speech/Language:   Clear and Coherent  ?Affect:  Depressed and anxious  ?Mood:  anxious and depressed  ?Thought process:  goal directed  ?Thought content:    WNL  ?Sensory/Perceptual disturbances:    WNL  ?Orientation:  oriented to person, place, time/date, situation, day of week, month of year, year, and stated date of December 19, 2021  ?Attention:  Good  ?Concentration:  Good  ?Memory:  WNL  ?Fund of knowledge:   Good  ?Insight:    Good  ?Judgment:   Good  ?Impulse Control:  Good and Fair  ? ?Risk Assessment: ?Danger to Self:  No ?Self-injurious Behavior: No ?Danger to Others: No ?Duty to Warn:no ?Physical Aggression / Violence:No  ?Access to Firearms a concern: No  ?Gang Involvement:No  ? ?Subjective: Patient in today reporting anxiety as stronger symptom along with depression, and difficulty motivation. Feeling less energy.  Working on not postponing things she needs to do. Still feels increased Luvox is helping. ?Depression lessening some. Working to let go of guilt about not seeing her sister in retirement home as often as she used to, and discussed this more today, deciding to try to visit her sister once weekly.  Working to be more consistent and making better food choices based on her prediabetic condition.  Procrastination discussed today along with some specific behaviors that can help her in breaking her cycle of procrastination and feeling better about herself and getting things done in a more timely manner.  Spent some specific time talking about motivation to help with stopping the procrastination and also making the  commitment to herself to make good choices regarding her prediabetes and other health related benefits.  Gradual improvement in her self love and adds that she has cut back on the self negating.  Newer behaviors are hard for her to maintain at times but she is trying to hold onto her gains for now and hopefully to continue making progress on these issues, with the help of goal-directed behaviors. ? ?Interventions: Solution-Oriented/Positive Psychology, Ego-Supportive, and Insight-Oriented ? ?Treatment Goal plan: ?Patient not signing Tx Plan on computer screen due to Covid. ?Treatment Goals: ?Goals remain on Tx Plan as patient works with strategies to achieve her goals.  Progress will be documented each session under "Progress" section of Plan.  ?Long Term goal: ?Develop healthy cognitive patterns and beliefs about self and the world that lead to alleviation and help to prevent relapse of depression or anxiety. ?Short Term goal: ?Identify and replace depressive/anxious thinking that leads to depressive/anxious feelings and actions.Elevate her motivation and self-esteem.  ?Strategy: ?Educate the patient about cognitive restructuring including self-monitoring of automatic thoughts reflecting depressive/negative, and anxious beliefs ? ?Diagnosis: ?  ICD-10-CM   ?1. Generalized anxiety disorder  F41.1   ?  ? ?Plan:  Patient today reporting some less motivation but also went on recent trip to [place] and has been tired.  She did participate in session as she worked on her need to improve her motivation, stop procrastinating, stop self judgment on issues regarding her sister, and having a healthier sense of self and being able to encourage  herself as she works on her treatment goals, especially on issues where she is needing to break unhealthy habits.  Does acknowledge that she feels better about herself when she eats better and works on positive changes. Encouraged patient in her practice of more positive behaviors  including: Healthy boundaries with others, recognizing and reflecting on progress daily, interrupting her negative assumptions, consistent positive self talk, saying no when she needs to say no, stop being judgmental with herself, spend more time off her electronics daily, healthy nutrition and exercise, making better choices for herself that reflect healthier self-care, working with negative automatic thoughts and changing them to be more self accepting, intentionally looking for more positives daily, practice more proactive behaviors versus reactive behaviors as discussed in sessions, let go of past guilt that holds her back from moving forward, decrease overthinking and over analyzing, stay in the present focusing on what she can control, remain in contact with people who are supportive, pay close attention to how she views herself and emphasize her strengths and positives, and feel good about the strength she shows working with goal-directed behaviors to move in a direction that supports her improved emotional health and self-confidence. ? ?Goal review and progress/challenges noted with patient. ? ?Next appointment within 2 to 3 weeks. ? ?This record has been created using [employer].  Chart creation errors have been sought, but may not always have been located and corrected.  Such creation errors do not reflect on the standard of medical care provided. ? ? ?Mathis Fare, LCSW ? ? ? ? ? ? ? ? ? ? ? ? ? ? ? ? ? ? ?

## 2022-01-09 ENCOUNTER — Ambulatory Visit: Admitting: Psychiatry

## 2022-01-09 DIAGNOSIS — F411 Generalized anxiety disorder: Secondary | ICD-10-CM

## 2022-01-09 NOTE — Progress Notes (Signed)
?    [facility] Counselor/Therapist Progress Note ? ?Patient ID: Denise Liu, MRN: [id number],   ? ?Date: 01/09/2022 ? ?Time Spent: 55 minutes  ? ?Treatment Type: Individual Therapy ? ?Reported Symptoms: anxiety, frustration, overwhelmed, depression ? ?Mental Status Exam: ? ?Appearance:   Casual     ?Behavior:  Appropriate, Sharing, and Motivated  ?Motor:  Normal  ?Speech/Language:   Clear and Coherent  ?Affect:  Anxious, some depression  ?Mood:  anxious and depressed  ?Thought process:  goal directed  ?Thought content:    WNL  ?Sensory/Perceptual disturbances:    WNL  ?Orientation:  oriented to person, place, time/date, situation, day of week, month of year, year, and stated date of Jan 09, 2022  ?Attention:  Good  ?Concentration:  Good and Fair  ?Memory:  WNL  ?Fund of knowledge:   Good  ?Insight:    Good  ?Judgment:   Good  ?Impulse Control:  Good  ? ?Risk Assessment: ?Danger to Self:  No ?Self-injurious Behavior: No ?Danger to Others: No ?Duty to Warn:no ?Physical Aggression / Violence:No  ?Access to Firearms a concern: No  ?Gang Involvement:No  ? ?Subjective:  Patient today reporting and processing her anxiety, depression, tiredness, overwhelmed with helping older sister re: med concerns, and frustrations personally and with sister not following medical advice. Denies any SI. Feels that her level of tiredness and doing a lot to help sister is exhausting her and she feels she's needing to set some limits. Husband planning to retire in August 2023.  Staying involved with church activities including her favorite "[employer]". Discussed ways of setting healthier limits as she helps with her sister's care and also having more free time to pursue some of her own interests.  Still some procrastination the patient feels it is related to her feelings of overwhelmedness which she is working on addressing and setting some healthier boundaries/limits per our session today. ? ?Interventions:  Solution-Oriented/Positive Psychology and Insight-Oriented ? ?Treatment Goal plan: ?Patient not signing Tx Plan on computer screen due to Covid. ?Treatment Goals: ?Goals remain on Tx Plan as patient works with strategies to achieve her goals.  Progress will be documented each session under "Progress" section of Plan.  ?Long Term goal: ?Develop healthy cognitive patterns and beliefs about self and the world that lead to alleviation and help to prevent relapse of depression or anxiety. ?Short Term goal: ?Identify and replace depressive/anxious thinking that leads to depressive/anxious feelings and actions.Elevate her motivation and self-esteem.  ?Strategy: ?Educate the patient about cognitive restructuring including self-monitoring of automatic thoughts reflecting depressive/negative, and anxious beliefs ?  ?Diagnosis: ?  ICD-10-CM   ?1. Generalized anxiety disorder  F41.1   ?  ? ?Plan: Patent today showing motivation and active participation as she focused on her own health and emotional wellbeing as well as helping her sister who lives on her own in a group facility and is having continued medical issues.  Worked today on setting healthier limits for herself so that she can still help her sister but also have the time that she needs to do things at her own home but also have some personal time for pleasure without feeling guilty.  Trying to focus better and more consistently on her food choices due to being prediabetic.  Excited about husband's upcoming retirement and is hoping that they can have some quality time and maybe plan a trip close to the time of his retirement.  Struggled more with anxiety, depression, procrastination, and tiredness recently, a lot of  which was related to trying to help provide some stability for her sister through some health and personal issues.  Patient's own self-care was a priority in our discussion today and she plans to follow through on taking some action for her own wellbeing in  terms of having time to do things with other people without guilt. Encouraged patient to be practicing more positive behaviors including: Healthy boundaries with others, recognizing and reflecting on progress daily, interrupting her negative assumptions, consistent positive self talk, saying no when she needs to say no, stop being judgmental with herself, spend more time off her electronics daily, healthy nutrition and exercise, making better choices for herself and reflect healthier self-care, working with negative automatic thoughts and changing them to be more self accepting, intentionally looking for more positives daily, practicing more proactive behaviors versus reactive behaviors as discussed in session, letting go of past guilt that holds her back from moving forward, decrease overthinking and over analyzing, stay in the present focusing on what she can control, remain in contact with people who are supportive, pay close attention to how she views herself and emphasize her strengths and positives, and recognize the strength she shows working with goal directed behaviors to move in a direction that supports her improved emotional health. ? ?Goal review and progress/challenges notes with patient. ? ?Next appt within 3 weeks. ? ?This record has been created using [employer].  Chart creation errors have been sought, but may not always have been located and corrected.  Such creation errors do not reflect on the standard of medical care provided.  ? ?Mathis Fare, LCSW ? ? ? ? ? ? ? ? ? ? ? ? ? ? ? ? ? ? ?

## 2022-01-10 ENCOUNTER — Ambulatory Visit: Admitting: Physician Assistant

## 2022-01-22 ENCOUNTER — Ambulatory Visit (INDEPENDENT_AMBULATORY_CARE_PROVIDER_SITE_OTHER): Admitting: Psychiatry

## 2022-01-22 DIAGNOSIS — F411 Generalized anxiety disorder: Secondary | ICD-10-CM | POA: Diagnosis not present

## 2022-01-22 NOTE — Progress Notes (Signed)
[facility] Counselor/Therapist Progress Note  Patient ID: Denise Liu, MRN: [id number],    Date: 01/22/2022  Time Spent: 55 minutes   Treatment Type: Individual Therapy  Reported Symptoms: anxiety (improved), depression (improving some), procrastination is a challenge at times  Mental Status Exam:  Appearance:   Casual     Behavior:  Appropriate, Sharing, and Motivated  Motor:  Normal  Speech/Language:   Clear and Coherent  Affect:  Anxious and some depression  Mood:  anxious and depressed  Thought process:  goal directed  Thought content:    Overthinking  Sensory/Perceptual disturbances:    WNL  Orientation:  oriented to person, place, time/date, situation, day of week, month of year, year, and stated date of Jan 22, 2022  Attention:  Good  Concentration:  Good and Fair  Memory:  WNL  Fund of knowledge:   Good  Insight:    Good  Judgment:   Good  Impulse Control:  Fair   Risk Assessment: Danger to Self:  No Self-injurious Behavior: No Danger to Others: No Duty to Warn:no Physical Aggression / Violence:No  Access to Firearms a concern: No  Gang Involvement:No   Subjective:  Patient in today reporting anxiety and depression with both having improved, and also some procrastination. Reporting talking "myself out of being in a funk", "I like myself a little bit more", and not as much negative self-talk. Need "to not let other people bring me down." And that happens with my husband a lot, and with others.  Talked with husband about some of his behavior and words that tends to bring patient down emotionally, and gave him specific examples which seemed to help a little bit. Long term issue with one of her sisters discussed today as it still bothers her, this is sister that lives in  [place] ([address]) and not sister in asst living community. This sister has gone several yrs without speaking with patient. Patient unpacked multiple layers of issues between her and  sister in [place], which she wanted to share and process in session today as it has bothered her for a long time and continues to do so now.  Had hoped that things might soften between the 2 of them over the years but it has not.  Patient has tried reaching out to her  several times the past 3-4 yrs. Shared more about all her other siblings as there as 6 siblings other than. Patient states she may continue to include that sister when sending out family emails or notes online, as she would be very open to trying to have a relationship with this sister going forward and not rehashing the past.  Still helping oversee the care of her older sister that lives locally in a retirement center, and their relationship seems to be okay although patient does have to set some limits for her own health and to be able to have some interests of her own.  Remains involved with church activities that she enjoys especially a hand bell choir.  Less feelings of overwhelmingness and is looking forward to her husband's retirement in August and a vacation before that time.  Continue working on appropriate boundaries with certain family and friends as needed.  Interventions: Solution-Oriented/Positive Psychology, Ego-Supportive, and Insight-Oriented   Treatment Goal plan: Patient not signing Tx Plan on computer screen due to Covid. Treatment Goals: Goals remain on Tx Plan as patient works with strategies to achieve her goals.  Progress will be documented each session  under "Progress" section of Plan.  Long Term goal: Develop healthy cognitive patterns and beliefs about self and the world that lead to alleviation and help to prevent relapse of depression or anxiety. Short Term goal: Identify and replace depressive/anxious thinking that leads to depressive/anxious feelings and actions.Elevate her motivation and self-esteem.  Strategy: Educate the patient about cognitive restructuring including self-monitoring of automatic  thoughts reflecting depressive/negative, and anxious beliefs  Diagnosis:   ICD-10-CM   1. Generalized anxiety disorder  F41.1      Plan: Patient today showing good participation and motivation in session as she worked o her anxiety and depression which are decreasing.  Stated that she is learning to be less self judgmental and is learning to like herself a bit more, having less self talk, and a growing awareness that "I need to not let people bring me down".  Discussed with patient what all this means for her and the limits and boundaries that could help her.  Also processed some feelings that she said she has been wanting to discuss but has not been quite ready.  Those feelings are regarding a sister who has chosen to not speak to patient over the years and patient explains that sister was not happy when patient was not able to attend mother's funeral years ago.  Patient wanting today to process some of her feelings about "the way the relationship is gone with that sister and wants to open the door for the sister and her to have contact if sister is willing, in order to move forward and at least be on speaking terms".  Did well in addressing this in session and arrived at a couple of options that she may pursue as she includes sister and any future email messages or communication with other siblings, and see if sister responds in any way.  Focusing more on relationships but also wanting healthy boundaries.  Continue to encourage patient to make her own self-care a priority and being able to say no when she needs to say no without feeling guilty. Encouraged patient in her practice of more positive behaviors including: Interrupting her negative assumptions, consistent positive self talk, healthy boundaries with others, recognizing and reflecting on progress often, saying no when she needs to say no, stop being judgmental with herself, healthy nutrition and exercise, spend time off of her electronics daily,  making better choices for herself and reflect healthier self-care, working with negative automatic thoughts and changing them to be more self accepting, intentionally looking for more positives daily, practicing more proactive behaviors versus reactive behaviors as discussed in sessions, letting go of past guilt that holds her back from moving forward, decrease overthinking, stay in the present focusing on what she can control, staying in contact with people who are supportive, emphasize her own strengths and positives, and realize the strength she shows working with goal directed behaviors to move in a direction that supports her improved emotional health.  Goal review and progress/challenges noted with patient.  Next appointment within 2 to 3 weeks.  This record has been created using [employer].  Chart creation errors have been sought, but may not always have been located and corrected.  Such creation errors do not reflect on the standard of medical care provided.   Mathis Fare, LCSW

## 2022-01-29 ENCOUNTER — Ambulatory Visit: Admitting: Physician Assistant

## 2022-01-29 ENCOUNTER — Encounter: Payer: Self-pay | Admitting: Physician Assistant

## 2022-01-29 DIAGNOSIS — F3342 Major depressive disorder, recurrent, in full remission: Secondary | ICD-10-CM

## 2022-01-29 DIAGNOSIS — F411 Generalized anxiety disorder: Secondary | ICD-10-CM

## 2022-01-29 NOTE — Progress Notes (Signed)
[facility] Med Check  Patient ID: Denise Liu,  MRN: [id number]  PCP: Windy Carina, PA-C (Inactive)  Date of Evaluation: 01/29/2022 Time spent:20 minutes  Chief Complaint:  Chief Complaint   Anxiety; Depression; Follow-up     HISTORY/CURRENT STATUS: HPI For routine f/u.   Is doing really well. Rarely takes the hydroxyzine, but it does help when needed.  If she takes 2 it makes her really drowsy.  She has done that a time or 2 after she got home and she could not quiet her mind.  She has taken it during the day on a couple of occasions and was able to make it with just the 10 mg although it did make her a little sleepy.  Patient denies loss of interest in usual activities and is able to enjoy things.  Denies decreased energy.  Denies decreased motivation.   ADLs and personal hygiene are normal.  Appetite has not changed.  Weight is stable. No extreme sadness, tearfulness, or feelings of hopelessness.  Sleeps well most of the time.  Denies any changes in concentration, making decisions or remembering things. Denies suicidal or homicidal thoughts.  Patient denies increased energy with decreased need for sleep, no increased talkativeness, no racing thoughts, no impulsivity or risky behaviors, no increased spending, no increased libido, no grandiosity, no increased irritability or anger, and no hallucinations.  Denies dizziness, syncope, seizures, numbness, tingling, tremor, tics, unsteady gait, slurred speech, confusion. Denies muscle or joint pain, stiffness, or dystonia.  She is seeing Rockne Menghini, LCSW every 2 weeks.  Therapy is going well and it is very helpful.  Individual Medical History/ Review of Systems: Changes? :Yes    dx w/ dry eyes  Past medications for mental health diagnoses include: Topamax for migraines, modafinil, Zoloft, gabapentin she did not like the way it made her feel, Wellbutrin XL,   Allergies: Codeine, Wellbutrin [bupropion], and Topamax  [topiramate]  Current Medications:  Current Outpatient Medications:    atorvastatin (LIPITOR) 10 MG tablet, Take 10 mg by mouth daily., Disp: , Rfl:    b complex vitamins capsule, Take 1 capsule by mouth daily., Disp: , Rfl:    calcium carbonate 200 MG capsule, Take 600 mg by mouth daily., Disp: , Rfl:    Cholecalciferol (VITAMIN D3) 2000 UNITS TABS, Take 4,000 Int'l Units by mouth., Disp: , Rfl:    co-enzyme Q-10 50 MG capsule, Take 300 mg by mouth daily., Disp: , Rfl:    cyclobenzaprine (FLEXERIL) 10 MG tablet, Take 10 mg by mouth 3 (three) times daily as needed for muscle spasms., Disp: , Rfl:    fluticasone (FLONASE) 50 MCG/ACT nasal spray, 1 spray by Each Nare route daily., Disp: , Rfl:    fluvoxaMINE (LUVOX) 100 MG tablet, Take 2 tablets (200 mg total) by mouth at bedtime., Disp: 180 tablet, Rfl: 1   hydrOXYzine (ATARAX) 10 MG tablet, Take 1-2 tablets (10-20 mg total) by mouth 3 (three) times daily as needed., Disp: 60 tablet, Rfl: 0   levothyroxine (SYNTHROID) 25 MCG tablet, Take by mouth., Disp: , Rfl:    lidocaine (LIDODERM) 5 %, Place 1 patch onto the skin daily. Remove & Discard patch within 12 hours or as directed by MD, Disp: , Rfl:    losartan (COZAAR) 25 MG tablet, Take 25 mg by mouth daily., Disp: , Rfl:    Magnesium 250 MG TABS, Take by mouth., Disp: , Rfl:    Multiple Vitamins-Minerals (MULTIVITAMIN & MINERAL PO), Take by mouth., Disp: , Rfl:  naproxen sodium (ANAPROX) 220 MG tablet, Take 220 mg by mouth as needed., Disp: , Rfl:    omeprazole (PRILOSEC) 20 MG capsule, TAKE 1 CAPSULE DAILY, Disp: , Rfl:    potassium chloride SA (KLOR-CON) 20 MEQ tablet, Take by mouth., Disp: , Rfl:    Glucosamine-Chondroit-Vit C-Mn (GLUCOSAMINE 1500 COMPLEX PO), Take by mouth. (Patient not taking: Reported on 09/12/2021), Disp: , Rfl:    MANGANESE PO, Take by mouth. (Patient not taking: Reported on 01/29/2022), Disp: , Rfl:    Misc Natural Products (TART CHERRY ADVANCED PO), Take by mouth.  (Patient not taking: Reported on 01/29/2022), Disp: , Rfl:    traMADol (ULTRAM) 50 MG tablet, Take 50 mg by mouth every 6 (six) hours as needed for pain. (Patient not taking: Reported on 03/20/2020), Disp: , Rfl:  Medication Side Effects: none  Family Medical/ Social History: Changes? No  MENTAL HEALTH EXAM:  There were no vitals taken for this visit.There is no height or weight on file to calculate BMI.  General Appearance: Casual, Neat, Well Groomed and Obese  Eye Contact:  Good  Speech:  Clear and Coherent and Normal Rate  Volume:  Normal  Mood:  Euthymic  Affect:  Congruent  Thought Process:  Goal Directed and Descriptions of Associations: Intact  Orientation:  Full (Time, Place, and Person)  Thought Content: Logical   Suicidal Thoughts:  No  Homicidal Thoughts:  No  Memory:  WNL  Judgement:  Good  Insight:  Good  Psychomotor Activity:  Normal  Concentration:  Concentration: Good  Recall:  Good  Fund of Knowledge: Good  Language: Good  Assets:  Desire for Improvement  ADL's:  Intact  Cognition: WNL  Prognosis:  Good    DIAGNOSES:    ICD-10-CM   1. Generalized anxiety disorder  F41.1     2. Recurrent major depressive disorder, in full remission [facility])  F33.42        Receiving Psychotherapy: Yes   Rockne Menghini, LCSW  RECOMMENDATIONS:  PDMP was reviewed.  No results available. I provided 20 minutes of face to face time during this encounter, including time spent before and after the visit in records review, medical decision making, counseling pertinent to today's visit, and charting.  I am glad to see her doing so well.  No change in treatment necessary.  Continue Luvox 100 mg, 2 po qhs.  Continue hydroxyzine 10 mg, 1-2 3 times daily as needed anxiety. Continue therapy with Rockne Menghini, LCSW. Return in 6 months.  Melony Overly, PA-C

## 2022-02-06 ENCOUNTER — Ambulatory Visit: Admitting: Psychiatry

## 2022-02-20 ENCOUNTER — Ambulatory Visit (INDEPENDENT_AMBULATORY_CARE_PROVIDER_SITE_OTHER): Admitting: Psychiatry

## 2022-02-20 DIAGNOSIS — F411 Generalized anxiety disorder: Secondary | ICD-10-CM | POA: Diagnosis not present

## 2022-02-20 NOTE — Progress Notes (Signed)
[facility] Counselor/Therapist Progress Note  Patient ID: Denise Liu, MRN: [id number],    Date: 02/20/2022  Time Spent: 55 minutes   Treatment Type: Individual Therapy  Reported Symptoms:  anxiety, some difficulty "at times of getting up and getting going", "No depression"  Mental Status Exam:  Appearance:   Casual     Behavior:  Appropriate, Sharing, and Motivated  Motor:  Normal  Speech/Language:   Clear and Coherent  Affect:  anxiety  Mood:  anxious  Thought process:  goal directed  Thought content:    overthinking  Sensory/Perceptual disturbances:    WNL  Orientation:  oriented to person, place, time/date, situation, day of week, month of year, year, and stated date of February 20, 2022  Attention:  Fair  Concentration:  Good and Fair  Memory:  WNL  Fund of knowledge:   Good  Insight:    Good and Fair  Judgment:   Good  Impulse Control:  Good   Risk Assessment: Danger to Self:  No Self-injurious Behavior: No Danger to Others: No Duty to Warn:no Physical Aggression / Violence:No  Access to Firearms a concern: No  Gang Involvement:No   Subjective:  Patient today reports anxiety but No depression so is making progress. Still procrastinating some and making efforts to decrease this.  Still using strategy "to talk myself out of being in a funk".  Also reports she is liking herself more and using more positive self-talk. Husband "is getting better and not nagging me and not using words that bring patient down emotionally."  Overseeing care of older sister living in retirement center and that has been going "a little better".  Less feelings of being overwhelmed and also having times where she can interrupt the feeling of overwhelmedness. States today that husband is now planning to retire in September vs August and they are both "ready".  Anxiety is decreasing some and showing more tolerance when things do not always go as planned or expected.  Interventions:  Solution-Oriented/Positive Psychology and Insight-Oriented  Treatment Goal plan: Patient not signing Tx Plan on computer screen due to Covid. Treatment Goals: Goals remain on Tx Plan as patient works with strategies to achieve her goals.  Progress will be documented each session under "Progress" section of Plan.  Long Term goal: Develop healthy cognitive patterns and beliefs about self and the world that lead to alleviation and help to prevent relapse of depression or anxiety. Short Term goal: Identify and replace depressive/anxious thinking that leads to depressive/anxious feelings and actions.Elevate her motivation and self-esteem.  Strategy: Educate the patient about cognitive restructuring including self-monitoring of automatic thoughts reflecting depressive/negative, and anxious beliefs   Diagnosis:   ICD-10-CM   1. Generalized anxiety disorder  F41.1      Plan: Patient today showing good motivation and participation in session as she worked further on her anxiety and managing some situations within the family.  Reports no depression most recently including today.  Has done some good work in dealing with her depression earlier and is working consistently on trying to reduce her anxiety more.  Did have a question regarding a medication that would relate to her anxiety and is going to check that out with her med provider, Melony Overly, PA-C.  Has maintained some of her progress in being less judgmental and more positive self talk, liking herself more, and working to not let other people bring her down as much.  In some situations, she reports having better limits and boundaries  and is working to continue this.  Self-esteem seems to be a little better. Some issues with a sister continue.  Continues to oversee the care of another sister living in a retirement center.  To put more focus on her own self-care and consider an increase in communication with her other siblings.  Continue healthy  boundaries and being able to say no when needed.  Encouraged to use journaling between sessions as needed and to take some time for herself each day focusing on her positives and some of the progress she is making through goal directed behaviors. Encouraged patient in her practice of more positive behaviors including: Keeping healthy boundaries with others, recognizing and reflecting on her progress often, interrupting her negative assumptions, positive self talk, saying no when she needs to say no, stop being judgmental with herself, healthy nutrition and exercise, spend time off of her electronics daily, making better choices for herself, healthier self-care, working with negative automatic thoughts and changing them to be more self accepting, intentionally looking for more positives each day, practice more proactive behaviors versus reactive behaviors as discussed in session, letting go of past guilt that holds her back from moving forward, decrease overthinking, stay in the present focusing on what she can control, staying in contact with people who are supportive, emphasize her own strengths and positives, and recognize the strength she shows working with goal directed behaviors to move in a direction that supports her improved emotional health and overall outlook.  Goal review and progress/challenges noted with patient.  Next appointment within 2 to 3 weeks.  This record has been created using [employer].  Chart creation errors have been sought, but may not always have been located and corrected.  Such creation errors do not reflect on the standard of medical care provided.    Mathis Fare, LCSW

## 2022-03-06 ENCOUNTER — Ambulatory Visit (INDEPENDENT_AMBULATORY_CARE_PROVIDER_SITE_OTHER): Admitting: Psychiatry

## 2022-03-06 DIAGNOSIS — F411 Generalized anxiety disorder: Secondary | ICD-10-CM | POA: Diagnosis not present

## 2022-03-06 NOTE — Progress Notes (Signed)
[facility] Counselor/Therapist Progress Note  Patient ID: Denise Liu, MRN: [id number],    Date: 03/06/2022  Time Spent: 55 minutes   Treatment Type: Individual Therapy  Reported Symptoms:  anxiety, "not really depression"  Mental Status Exam:  Appearance:   Casual     Behavior:  Appropriate, Sharing, and Motivated "on some things"  Motor:  Normal  Speech/Language:   Clear and Coherent  Affect:  anxious  Mood:  anxious  Thought process:  goal directed  Thought content:    Some overthinking  Sensory/Perceptual disturbances:    WNL  Orientation:  Fully oriented to person, place, and date of March 06, 2022  Attention:  Good  Concentration:  Good and Fair  Memory:  WNL  Fund of knowledge:   Good  Insight:    Good and Fair  Judgment:   Fair  Impulse Control:  Fair   Risk Assessment: Danger to Self:  No Self-injurious Behavior: No Danger to Others: No Duty to Warn:no Physical Aggression / Violence:No  Access to Firearms a concern: No  Gang Involvement:No   Subjective:  Patient in today venting anxiety some stress re: husband and his work/eventual retirement. Also expressing concerns for very close friend of her whose family is going through sad and uncertain health situations. Needing and trying to stay more involved in her church and other local activities. Wanting and needing to start back up with exercise and may ask neighbor about walking together. Seemed to feel more motivation in several things, the more she talked today. Discussed some steps she could take to follow through with more motivation and determination and tasks that she needs to do, and exercise for better health, and in some activities that she just wants to do because of her personal interests. Focus with patient on some motivation strategies using specific examples that can help her and ways of tracking her progress that she found interesting and helpful.  Continues to oversee the care of her older  sister living in a retirement center in that situation seems to be going better.  Interventions: Cognitive Behavioral Therapy and Ego-Supportive  Treatment Goals: Goals remain on Tx Plan as patient works with strategies to achieve her goals.  Progress will be documented each session under "Progress" section of Plan.  Long Term goal: Develop healthy cognitive patterns and beliefs about self and the world that lead to alleviation and help to prevent relapse of depression or anxiety. Short Term goal: Identify and replace depressive/anxious thinking that leads to depressive/anxious feelings and actions.Elevate her motivation and self-esteem.  Strategy: Educate the patient about cognitive restructuring including self-monitoring of automatic thoughts reflecting depressive/negative, and anxious beliefs  Diagnosis:   ICD-10-CM   1. Generalized anxiety disorder  F41.1      Plan: Patient needing to work further to regain some momentum in her motivation and follow through and goal directed behaviors and we discussed this in session today.  Trying to work further on her anxiety as it has heightened some with certain situations as noted above in subjective section.  She is pleasant and wanting to be more motivated and seemed to be towards the end of session and we discussed how she can carry this forward once she leaves.  Tracking progress seem to be of interest to her.  And she has several areas that she is wanting to increase her involvement in and feels that this would affect her mood positively as well as her physical health.  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  Encouraged patient to continue her healthy boundaries as  needed, using journaling as a tool in between sessions, focusing more on her positives versus negatives, and following through on target areas we mentioned today and noted above. Encouraged patient in her practice of positive behaviors as noted in session including: Keeping healthy boundaries with others, recognizing and reflecting on her progress often, interrupting her negative assumptions, positive self talk, saying no when she needs to say no, stop being judgmental with herself, healthy nutrition and exercise, making better choices for herself, healthier self-care, working with negative automatic thoughts and changing them to be more self accepting, intentionally looking for more positives each day, practice more proactive behaviors versus reactive behaviors, letting go of past guilt that holds her back from moving forward, decrease overthinking, stay in the present focusing on what she can control, staying in contact with people who are supportive, emphasize her own strengths and positives, and realize the strength she shows working with goal directed behaviors to move in a direction that supports her improved emotional health.  Goal review and progress/challenges noted with patient.  Next appointment within 2 to 3 weeks.  This record has been created using [employer].  Chart creation errors have been sought, but may not always have been located and corrected.  Such creation errors do not reflect on the standard of medical care provided.   Mathis Fare, LCSW

## 2022-03-13 ENCOUNTER — Other Ambulatory Visit: Payer: Self-pay | Admitting: Physician Assistant

## 2022-04-03 ENCOUNTER — Ambulatory Visit (INDEPENDENT_AMBULATORY_CARE_PROVIDER_SITE_OTHER): Admitting: Psychiatry

## 2022-04-03 DIAGNOSIS — F411 Generalized anxiety disorder: Secondary | ICD-10-CM

## 2022-04-03 NOTE — Progress Notes (Signed)
[facility] Counselor/Therapist Progress Note  Patient ID: Denise Liu, MRN: [id number],    Date: 04/03/2022  Time Spent: 55 minutes   Treatment Type: Individual Therapy  Reported Symptoms: anxiety, some depression more recently, overthinking  Mental Status Exam:  Appearance:   Casual and Neat     Behavior:  Appropriate, Sharing, and Motivated  Motor:  Normal  Speech/Language:   Clear and Coherent  Affect:  Depressed and anxious  Mood:  anxious and depressed  Thought process:  goal directed  Thought content:    overthinking  Sensory/Perceptual disturbances:    WNL  Orientation:  oriented to person, place, time/date, situation, day of week, month of year, year, and stated date of April 03, 2022  Attention:  Good  Concentration:  Good and Fair  Memory:  WNL  Fund of knowledge:   Good  Insight:    Good  Judgment:   Good  Impulse Control:  Good   Risk Assessment: Danger to Self:  No Self-injurious Behavior: No Danger to Others: No Duty to Warn:no Physical Aggression / Violence:No  Access to Firearms a concern: No  Gang Involvement:No   Subjective: Patient today reporting anxiety and depression related mostly to personal and older sister (for whom she cares for although sister is in independent living situation. Most recently relationship and meeting needs of older sister has been much more stressful that previously. Sister is often very challenging to work with and leading to significant frustration and stress for patient. Doctor has told patient she is diabetic after being pre-diabetic for over a year. Worked with patient on better self-care including setting limits, allow for better sleep patterns, get up in the mornings within 1/2 hr of waking up, get outside and walking some, attend nutrition clinic and diabetic clinic recommended by her PCP, use good nutrition per doctor and the previously mentioned clinic, and set healthier limits with sister, allow for more social  interaction with others, and practice more positive self talk.  Referred back to the last session when we discussed some motivation strategies and ways of tracking progress that patient had found to be interesting and potentially helpful, and encouraged her to consider this in connection with them behaviors recommended above.  Interventions: Cognitive Behavioral Therapy, Solution-Oriented/Positive Psychology, and Ego-Supportive  Treatment Goals: Goals remain on Tx Plan as patient works with strategies to achieve her goals.  Progress will be documented each session under "Progress" section of Plan.  Long Term goal: Develop healthy cognitive patterns and beliefs about self and the world that lead to alleviation and help to prevent relapse of depression or anxiety. Short Term goal: Identify and replace depressive/anxious thinking that leads to depressive/anxious feelings and actions.Elevate her motivation and self-esteem.  Strategy: Educate the patient about cognitive restructuring including self-monitoring of automatic thoughts reflecting depressive/negative, and anxious beliefs   Diagnosis:   ICD-10-CM   1. Generalized anxiety disorder  F41.1      Plan: Patient today showing good participation and motivation in session although admits outside of sessions, her motivation can be sporadic.  For example, has difficulty "making herself get out of bed" unless she has something specifically planned.  Picked up from work done last session regarding increasing her motivation and also her follow through on goal-directed behaviors that are suggested and to which she agrees would be helpful.  Anxiety today seems much more related to her older sister needing patient's help with some relatively big decisions.  Looked at how patient can set some  realistic limits with sister and still be helpful with her, and also encouraging sister to realize patient's need for boundaries at times regarding her time and  availability, so as to allow patient to be able to focus on her own self-care as well.  Encouraging patient more to track progress made even if she feels it is small amounts.  Also encouraged her to revisit the idea of maybe being involved in some type of community service activities as she had had an interest in that.  To increase her healthy boundaries as needed and to use journaling as a tool in between sessions, trying to focus more on positives rather than the negatives. Encouraged patient in the practice of more positive behaviors including: Recognizing and reflecting on her progress often, interrupting her negative assumptions, using more positive self talk, keeping healthy boundaries with others, saying no when she needs to say no, stop being judgmental with herself, healthy nutrition and exercise, making better choices for herself, healthier self-care, working with negative automatic thoughts and changing them to be more self accepting, intentionally looking for more positives each day, practice more proactive behaviors versus reactive behaviors, letting go of past guilt that holds her back from moving forward, decrease overthinking, stay in the present focusing on what she can control or change, staying in contact with people who are supportive, emphasize her own strengths and positives, and recognize the strength she shows working with goal directed behaviors to move in a direction that supports her improved emotional health and overall wellbeing.  Goal review and progress/challenges noted with patient.  Next appointment within 3 weeks.  This record has been created using [employer].  Chart creation errors have been sought, but may not always have been located and corrected.  Such creation errors do not reflect on the standard of medical care provided.   Mathis Fare, LCSW

## 2022-04-22 ENCOUNTER — Ambulatory Visit (INDEPENDENT_AMBULATORY_CARE_PROVIDER_SITE_OTHER): Admitting: Psychiatry

## 2022-04-22 DIAGNOSIS — F411 Generalized anxiety disorder: Secondary | ICD-10-CM

## 2022-04-22 NOTE — Progress Notes (Signed)
[facility] Counselor/Therapist Progress Note  Patient ID: Denise Liu, MRN: [id number],    Date: 04/22/2022  Time Spent: 57 minutes   Treatment Type: Individual Therapy  Reported Symptoms: anxiety, some depression (some less), having issue with my fibromyalgia  Mental Status Exam:  Appearance:   Neat     Behavior:  Appropriate, Sharing, and Motivated  Motor:  Normal  Speech/Language:   Clear and Coherent  Affect:  Anxious, some depression  Mood:  anxious and depressed  Thought process:  goal directed  Thought content:    WNL  Sensory/Perceptual disturbances:    WNL  Orientation:  oriented to person, place, time/date, situation, day of week, month of year, year, and stated date of April 22, 2022  Attention:  Fair  Concentration:  Good  Memory:  "A little more short term memory issues"  Fund of knowledge:   Good  Insight:    Good  Judgment:   Good  Impulse Control:  Fair especially with food choices   Risk Assessment: Danger to Self:  No Self-injurious Behavior: No Danger to Others: No Duty to Warn:no Physical Aggression / Violence:No  Access to Firearms a concern: No  Gang Involvement:No   Subjective:  Patient in today reporting anxiety and some depression. Some poor decisions with food and less healthy choices.  Looking at what may help her make more healthy choices as she buys her groceries, and planning her meals ahead of time. Husband is retiring after working over 40 years. Found out recently per her Dr that she is Type 2 Diabetes and is to continue her current medication for now and is to follow up with her PCP. Also having some arthritic and nerve pain and takes Advil and Tylenol which helps some. Trying to get out of her house more. Easy to get discouraged when alone. Is getting out a little more and that helps. Motivation some better. Involved in national conference for [employer] pilgrimage coming up soon in Sept. Less obsessiveness in her thoughts.  States "I need to stop watching so many medical shows on TV as I over-identify with them and it's really a waste of time and sometimes have nightmares." Encouraged patient to follow through on her limiting her watching of such shows especially since she recognizes that it is disturbing to her. Older sister's situation not as volatile at the moment. Patient setting limits as needed.  Enjoys helping others through her church and local homeless shelter and helps her feel better also.    Interventions: Cognitive Behavioral Therapy and Ego-Supportive   Treatment Goals: Goals remain on Tx Plan as patient works with strategies to achieve her goals.  Progress will be documented each session under "Progress" section of Plan.  Long Term goal: Develop healthy cognitive patterns and beliefs about self and the world that lead to alleviation and help to prevent relapse of depression or anxiety. Short Term goal: Identify and replace depressive/anxious thinking that leads to depressive/anxious feelings and actions.Elevate her motivation and self-esteem.  Strategy: Educate the patient about cognitive restructuring including self-monitoring of automatic thoughts reflecting depressive/negative, and anxious beliefs  Diagnosis:   ICD-10-CM   1. Generalized anxiety disorder  F41.1      Plan: Patient today showing active participation and motivation in session today. Smiling more today.  Showing noticeably more follow through and her treatment goal was and seeing results.  Being very active in her church is helpful to her.  Setting more realistic limits with sister as needed.  Paying attention to her progress more.  Has followed up more intentionally on goal-directed behaviors recently and reports feeling more positive.  Encouraging others more especially in her church groups, and receiving positive feedback from them as they notice her improvement.  Encouraged to continue using journaling as a tool in between sessions,  focusing more on the positives she experiences versus the negatives. Encouraged patient in her practice of more positive behaviors including: Positive self talk, keeping healthy boundaries with others, recognizing her progress often, interrupting negative assumptions, saying no when she needs to say no without guilt, stop being judgmental with herself, healthy nutrition and exercise, making better choices for herself, healthier self-care, working with negative automatic thoughts and trying to change them to be more self accepting, intentionally looking for more positives each day, practice more proactive behaviors versus reactive behaviors, decrease overthinking, stay in the present focusing on what she can control or change, staying in contact with people who are supportive, emphasizing her own strengths and positives, and realize the strength she shows working with goal directed behaviors to move in a direction that supports her improved emotional health.  Goal review and progress/challenges noted with patient.  Next appointment within 3 to 4 weeks.  This record has been created using [employer].  Chart creation errors have been sought, but may not always have been located and corrected.  Such creation errors do not reflect on the standard of medical care provided.   Mathis Fare, LCSW

## 2022-05-06 ENCOUNTER — Ambulatory Visit (INDEPENDENT_AMBULATORY_CARE_PROVIDER_SITE_OTHER): Admitting: Psychiatry

## 2022-05-06 DIAGNOSIS — F411 Generalized anxiety disorder: Secondary | ICD-10-CM | POA: Diagnosis not present

## 2022-05-06 NOTE — Progress Notes (Signed)
[facility] Counselor/Therapist Progress Note  Patient ID: Denise Liu, MRN: [id number],    Date: 05/06/2022  Time Spent: 55 minutes   Treatment Type: Individual Therapy  Reported Symptoms: anxiety, some depression  Mental Status Exam:  Appearance:   Casual     Behavior:  Appropriate, Sharing, and becoming more motivated  Motor:  Normal  Speech/Language:   Clear and Coherent  Affect:  Anxious, some depression  Mood:  anxious and some depression  Thought process:  normal  Thought content:    WNL  Sensory/Perceptual disturbances:    WNL  Orientation:  oriented to person, place, time/date, situation, day of week, month of year, year, and stated date of Sept. 5, 2023  Attention:  Good  Concentration:  Good  Memory:  WNL  Fund of knowledge:   Good  Insight:    Good  Judgment:   Good  Impulse Control:  Better /Fair ; "I've been more conscious about my eating; still watch too much TV"   Risk Assessment: Danger to Self:  No Self-injurious Behavior: No Danger to Others: No Duty to Warn:no Physical Aggression / Violence:No  Access to Firearms a concern: No  Gang Involvement:No   Subjective: Patient today reports anxiety and some depression. Still "watch too much TV but trying to decrease the amount."  Doing better with food choices. Husband has retired (after over 40 yrs of working) and they are in process of adjusting to retirement. Older sister still having issues and relies on patient a lot for decision-making, transportation and overseeing her medical care, however it not as high need as she has been in the past.  Learning how to care for her Type 2 Diabetes. Arthritic and nerve pain and also fibromyalgia, and Tylenol and Advil helps a lot. Needing to get out of hours more. Church scheduling getting started and patient plans to be involved with handbells, [employer] , and church choir. Trying to figure out what I want to to that is more purposeful in her  retirement years and be involved in more activities. Feels it would help her depression and processed this more today and encouraged to think and process this more in between sessions and will pick up next session as we ran out of time today. Motivation better.  Particularly enjoys helping other people through her church and the local homeless shelter. Outlook some better. Depression decreased some.  Obsessiveness and her thoughts has definitely decreased.     Interventions: Cognitive Behavioral Therapy and Ego-Supportive  Treatment Goals: Goals remain on Tx Plan as patient works with strategies to achieve her goals.  Progress will be documented each session under "Progress" section of Plan.  Long Term goal: Develop healthy cognitive patterns and beliefs about self and the world that lead to alleviation and help to prevent relapse of depression or anxiety. Short Term goal: Identify and replace depressive/anxious thinking that leads to depressive/anxious feelings and actions.Elevate her motivation and self-esteem.  Strategy: Educate the patient about cognitive restructuring including self-monitoring of automatic thoughts reflecting depressive/negative, and anxious beliefs   Diagnosis:   ICD-10-CM   1. Generalized anxiety disorder  F41.1      Plan: Patient in today and working on her anxiety and her depression.  Is grateful that her depression has decreased some.  Working on Engineer, materials, setting appropriate boundaries with her older sister while still helping her as needed, learning to take care of her own type 2 diabetes, and trying to get back involved  in more church and community activities as noted above.  These activities have proven before to be of help to her socially and emotionally.  Wanting to spend her retirement years in a more purposeful way and is giving this considerable thought.  Feels having a little more of a plan in place would help her emotional stability as well, as  he adjusts to a little bit of a decrease in income but not significant.  Is noticing some progress on her goals and that is helping her mood as well. Has been trying to get up earlier in the mornings and take a walk with husband. Looking at other changes she might make for her own physical health as well as emotional health.  Smiles more often and is more quick to see some of her positives.Encouraged patient in practicing positive behaviors including: Keeping healthy boundaries with others, seeing her progress often, refraining from negative  assumptions, regularly using positive self talk, saying no when she needs to say no without guilt, refrain from being judgmental with herself, healthy nutrition and exercise, making better choices for herself, healthier self-care, working with negative automatic thoughts and trying to change them to be more self accepting, intentionally looking for more positives daily, practicing more proactive behaviors versus reactive behaviors, decrease her overthinking, remaining in the present focusing on what she can control or change, staying in contact with people who are supportive, emphasizing her own strengths and positives, and realize the strength she shows working with goal directed behaviors to move in a direction that supports her improved emotional health and overall wellbeing.  Goal review and progress/challenges noted with patient.  Next appointment within 2 to 3 weeks.  This record has been created using [employer].  Chart creation errors have been sought, but may not always have been located and corrected.  Such creation errors do not reflect on the standard of medical care provided.   Mathis Fare, LCSW

## 2022-05-09 ENCOUNTER — Other Ambulatory Visit: Payer: Self-pay | Admitting: Physician Assistant

## 2022-05-20 ENCOUNTER — Ambulatory Visit (INDEPENDENT_AMBULATORY_CARE_PROVIDER_SITE_OTHER): Admitting: Psychiatry

## 2022-05-20 DIAGNOSIS — F411 Generalized anxiety disorder: Secondary | ICD-10-CM

## 2022-05-20 NOTE — Progress Notes (Signed)
[facility] Counselor/Therapist Progress Note  Patient ID: Denise Liu, MRN: [id number],    Date: 05/20/2022  Time Spent: 55 minutes   Treatment Type: Individual Therapy  Reported Symptoms: anxiety "spells worse recently in group environment where I got overwhelmed"  Mental Status Exam:  Appearance:   Casual and Neat     Behavior:  Appropriate, Sharing, and Motivated  Motor:  Normal  Speech/Language:   Clear and Coherent  Affect:  anxiety  Mood:  anxious  Thought process:  goal directed  Thought content:    WNL  Sensory/Perceptual disturbances:    WNL  Orientation:  oriented to person, place, time/date, situation, day of week, month of year, year, and stated date of Sept. 19, 2023  Attention:  Fair  Concentration:  Fair  Memory:  Some short-term memory issues  Fund of knowledge:   Good  Insight:    Good and Fair  Judgment:   Good  Impulse Control:  Good   Risk Assessment: Danger to Self:  No Self-injurious Behavior: No Danger to Others: No Duty to Warn:no Physical Aggression / Violence:No  Access to Firearms a concern: No  Gang Involvement:No   Subjective:  Patient in today reporting anxiety and main symptom and escalated recently while involved with some group activities on multiple occasions. States it did help her some to acknowledge her anxiety peaking on those occasions and practiced giving herself permission to step away and have some space to herself which did prove to be helpful. Shared some other situations involving heightened anxiety since last session. Second-guessing herself some which she processed well in session and was able to share examples of how the doubting of herself has been difficult for her through the years.  Shares also how growing up in family of 6 children, she felt left out at times and "not as good as the others". Feels she is growing beyond this over the years, and still recognizes how she needs to work further on "my doubting how  others feel/think about me, and how I need to work on not turning things around to be negative about myself." Older sister in retirement community still interacting in negative/challenging ways. Patient trying to set healthier boundaries. Working to decrease her time watching TV. Trying to make healthier choices in food. Husband retires and they have more time together and find they are "still as busy as ever."  Taking care of her Type 2 Diabetes, while also having some nerve and arthritic pain and fibromyalgia and managing it with medication.  Did really well and working on her concerns during session today and it was noticeable that she smiled more and starting to feel some better about herself.  Motivation good.  Her overall outlook improving.  Reports no depression currently.  Interventions: Cognitive Behavioral Therapy and Ego-Supportive  Treatment Goals: Goals remain on Tx Plan as patient works with strategies to achieve her goals.  Progress will be documented each session under "Progress" section of Plan.  Long Term goal: Develop healthy cognitive patterns and beliefs about self and the world that lead to alleviation and help to prevent relapse of depression or anxiety. Short Term goal: Identify and replace depressive/anxious thinking that leads to depressive/anxious feelings and actions.Elevate her motivation and self-esteem.  Strategy: Educate the patient about cognitive restructuring including self-monitoring of automatic thoughts reflecting depressive/negative, and anxious beliefs  Diagnosis:   ICD-10-CM   1. Generalized anxiety disorder  F41.1      Plan:   Patient today  showing good motivation and active participation in session focusing on her escalated anxiety which has tended to worsen when she is in groups of people.  As noted above patient worked really well with this today in session showing more strength and openness.  Trying to reduce her second-guessing of herself and also  catch herself when she feels anxiety starting to increase especially in groups of people.  Practiced today in session with some positive self talk and interruption of her negative thoughts to help intervene in those cases but also giving herself permission to step back from the group if needed and if that proves to be helpful to her.  Her anxiety does not seem to be as prolonged as it has previously which she feels is an improvement.  Reviewed goal-directed behaviors and patient is showing good motivation.  Recognizing some of her progress more is helping feed that motivation.  Trying to get outside and walk more and make other healthy choices including setting boundaries with others as needed especially her older sister who can be very overly demanding of patient and the patient figuring out ways to be attentive to sister and yet not be "over used".  She and husband, who is recently retired, are both intentionally trying to make their retirement time purposeful and giving thought as to what has the most meaning for them in terms of plans going forward.  Definite improvement and emotional stability for patient as she personally is making progress on her goals but also feeling some stability in early phase of husband's retirement.  Reports they are working well together and adjusting to the difference and having a lot more time together, which so far has seemed to be positive versus negative. Encouraged patient in her practice of positive behaviors as noted in session including: Refraining from negative assumptions, use of positive self talk regularly, keeping healthy boundaries with others, noticing her progress often, saying no when she needs to say no and not feeling guilty, interrupt her being judgmental with herself, healthy nutrition and exercise, making better choices for herself, working with negative automatic thoughts and trying to change them to be more self accepting, healthier self-care, intentionally  looking for more positives each day, practicing more proactive behaviors versus reactive behaviors, decrease her overthinking, stay in the present focusing what she can control or change, staying in contact with people who are supportive, emphasizing her own strengths and positives, and recognize the strength she shows when working with goal directed behaviors to move in a direction that supports her improved emotional health and outlook.  Goal review and progress/challenges noted with patient.  Next appointment within 2 to 3 weeks.  This record has been created using [employer].  Chart creation errors have been sought, but may not always have been located and corrected.  Such creation errors do not reflect on the standard of medical care provided.   Mathis Fare, LCSW

## 2022-06-10 ENCOUNTER — Ambulatory Visit (INDEPENDENT_AMBULATORY_CARE_PROVIDER_SITE_OTHER): Admitting: Psychiatry

## 2022-06-10 DIAGNOSIS — F411 Generalized anxiety disorder: Secondary | ICD-10-CM | POA: Diagnosis not present

## 2022-06-10 NOTE — Progress Notes (Signed)
[facility] Counselor/Therapist Progress Note  Patient ID: Denise Liu, MRN: [id number],    Date: 06/10/2022  Time Spent: 55 minutes   Treatment Type: Individual Therapy  Reported Symptoms: anxious, decreased motivation after husband's retirement  Mental Status Exam:  Appearance:   Casual and Neat     Behavior:  Appropriate and Sharing  Motor:  Normal  Speech/Language:   Clear and Coherent  Affect:  anxious  Mood:  anxious  Thought process:  goal directed  Thought content:    overlooking  Sensory/Perceptual disturbances:    WNL  Orientation:  oriented to person, place, time/date, situation, day of week, month of year, year, and stated date of Oct. 10, 2023  Attention:  Good  Concentration:  Fair  Memory:  [place] of knowledge:   Good  Insight:    Good and Fair  Judgment:   Good  Impulse Control:  Fair   Risk Assessment: Danger to Self:  No Self-injurious Behavior: No Danger to Others: No Duty to Warn:no Physical Aggression / Violence:No  Access to Firearms a concern: No  Gang Involvement:No   Subjective:  Patient in today reporting anxiety related mostly to personal, retirement, "things I don't want to do", and "tax season approaching as we filed for late filing last year."  Recognizing some of her habits are contributing to her anxiety including her procrastination, feeling badly about herself, wanting to make healthier choices and needing to be more prompt in getting tasks done at home.  High expectations and quick to react negatively towards herself when she feels she has made a mistake or a bad decision.  Yet, very frustrated that she has a hard time committing to certain changes and following through.  Worked today on interrupting negative and anxious thoughts, self-doubting that can and do affect her relationship with others and her own self esteem. Patient identified 2 strategies to work on: stop procrastinating on tasks at home (choose at least 1 daily),  make healthier choices in eating and exercise (at least one daily).  Discussed with patient that we are making these 2 strategies more "do-able" as it has been very difficult for her to follow up previously on multiple strategies which she felt she should be able to do but follow through did not happen.  Wanting patient to achieve some success so as to build on that success, with which she agreed today.  She commits to keeping notes between now and her next session and will share at that time.  Interventions: Cognitive Behavioral Therapy and Ego-Supportive  Treatment Goals: Goals remain on Tx Plan as patient works with strategies to achieve her goals.  Progress will be documented each session under "Progress" section of Plan.  Long Term goal: Develop healthy cognitive patterns and beliefs about self and the world that lead to alleviation and help to prevent relapse of depression or anxiety. Short Term goal: Identify and replace depressive/anxious thinking that leads to depressive/anxious feelings and actions.Elevate her motivation and self-esteem.  Strategy: Educate the patient about cognitive restructuring including self-monitoring of automatic thoughts reflecting depressive/negative, and anxious beliefs  Diagnosis:   ICD-10-CM   1. Generalized anxiety disorder  F41.1      Plan: Patient today showing good anticipation and some motivation in session as she worked on issues related to her personal health, "things I do not want to do, and retirement.  Husband is newly retired and that has been an adjustment for both of them.  Resumed work on  some of her strategies that she is finding hard to follow through on so we decreased the number which will hopefully increase the chances of her being successful.  Patient has found it difficult to be successful very often in the past and agrees that if she is successful with even a couple of strategies that are more simple that might at least "get my motivation  back up some".  Discussed in session and encouraged patient in starting the strategies this week and not putting it off.  She seems motivated at end of session as we reviewed goal-directed behaviors together.  Continues to try and reduce the "second-guessing of myself" and insert positive messages as we discussed today.  Shared some appropriate positive self talk that patient can use on her own between sessions. In addition to her goal directed behaviors, I encouraged patient in practicing positive behaviors as noted in sessions including: Use of frequent positive self talk, refraining from making negative assumptions, keeping healthy boundaries with others, noticing her progress often, saying no when she needs to say no without feeling guilty, interrupt her being judgmental with herself, healthy nutrition and exercise, making better choices for herself, working with negative automatic thoughts and trying to challenge them to be more self accepting, healthier self-care, intentionally looking for more positives each day, practicing more proactive behaviors versus reactive behaviors, decrease her overthinking, remain in the present focusing on what she can control or change, staying in contact with people who are supportive, emphasizing her own strengths and positives, and realize the strength she shows working with goal directed behaviors to move in a direction that supports her improved emotional health and outlook.  Goal review and progress/challenges noted with patient.  Next appointment within 2-3 weeks.  This record has been created using [employer].  Chart creation errors have been sought, but may not always have been located and corrected.  Such creation errors do not reflect on the standard of medical care provided.   Shanon Ace, LCSW

## 2022-07-01 ENCOUNTER — Ambulatory Visit (INDEPENDENT_AMBULATORY_CARE_PROVIDER_SITE_OTHER): Admitting: Psychiatry

## 2022-07-01 DIAGNOSIS — F330 Major depressive disorder, recurrent, mild: Secondary | ICD-10-CM | POA: Diagnosis not present

## 2022-07-01 NOTE — Progress Notes (Signed)
[facility] Counselor/Therapist Progress Note  Patient ID: Denise Liu, MRN: [id number],    Date: 07/01/2022  Time Spent: 50 minutes   Treatment Type: Individual Therapy  Reported Symptoms:  depressed, tired, lower motivation that "comes and goes", frustration "with a friend and my sister", incident with friend where they both got angry and stopped talking to each other  Mental Status Exam:  Appearance:   Casual     Behavior:  Appropriate and Sharing  Motor:  Normal  Speech/Language:   Clear and Coherent  Affect:  Depressed  Mood:  anxious and depressed  Thought process:  goal directed  Thought content:    WNL  Sensory/Perceptual disturbances:    WNL  Orientation:  oriented to person, place, time/date, situation, day of week, month of year, year, and stated date of Oct. 31, 2023  Attention:  Good  Concentration:  Good and Fair  Memory:  [place] of knowledge:   Good  Insight:    Good and Fair  Judgment:   Good  Impulse Control:  Good   Risk Assessment: Danger to Self:  No Self-injurious Behavior: No Danger to Others: No Duty to Warn:no Physical Aggression / Violence:No  Access to Firearms a concern: No  Gang Involvement:No   Subjective: Patient in today reporting depression, anxiety, tired, motivation lower, less energy, and an unpleasant incident with a good friend and now the 2 of them are not communicating. Also not able to always meet her older sister's expectations for helping meet sister's needs and feels stress about that. Worked today on : Being able to form healthy boundaries with others and not feeling guilty, increasing her motivation and letting go of self-inflicted guilt, staying in touch more often with her friends.  Encouraged patient's decreasing her negative self talk and increasing her participation in church and community activities as those things often provide her with good socialization and meaningful activities which she typically enjoys.   Acknowledges that she and husband are also still very much in the "getting adjusted" phase of retirement which impacts both of them but does seem to be making progress.  Working on getting her standards of herself to not be excessively high and also to diminish herself negating when she feels that she has either not done something well or made a mistake.  Some improvement in catching and interrupting negative/anxious thoughts.  Working on decreasing self-doubt.  Working on decreasing her procrastinating on task at home as discussed in earlier session and making healthier choices and eating and exercise.  Encourage patient to feel good about even the small positive changes that she makes.  Interventions: Cognitive Behavioral Therapy and Ego-Supportive  Treatment Goals: Goals remain on Tx Plan as patient works with strategies to achieve her goals.  Progress will be documented each session under "Progress" section of Plan.  Long Term goal: Develop healthy cognitive patterns and beliefs about self and the world that lead to alleviation and help to prevent relapse of depression or anxiety. Short Term goal: Identify and replace depressive/anxious thinking that leads to depressive/anxious feelings and actions.Elevate her motivation and self-esteem.  Strategy: Educate the patient about cognitive restructuring including self-monitoring of automatic thoughts reflecting depressive/negative, and anxious beliefs   Diagnosis:   ICD-10-CM   1. Major depressive disorder, recurrent episode, moderate (HCC)  F33.1     2. 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  Worked on more boundary issues and her ability to form healthier boundaries without feeling guilty.  Also worked with reducing self-inflicted guilt and over the high expectations that tends to negatively impact her motivation level, as well as remaining  more regularly in touch with her friends as she has spent more time alone recently which she realizes also contributed to her lack of motivation.  Discussed some homework for patient to work on between sessions that might help her motivation and help her feel more connected to the therapy work she is doing and has earlier made some progress.  Encouraged her to be more forgiving of herself and also more realistic in her expectations of herself.  Continues to work along with her husband on this season of adjusting to retirement for them as patient had already retired and husband followed a few weeks ago. Encouraged patient in her practicing of positive behaviors including: Following through on goal-directed behaviors, use of frequent positive self talk, refraining from making negative assumptions, maintaining healthy boundaries with others, noticing her progress made, saying no when she needs to say no without feeling guilty, interrupt her being judgmental with herself, healthy nutrition and exercise, making better choices for herself, working with negative automatic thoughts and trying to challenge them to be more self accepting, healthier self-care, intentionally looking for more positives each day, practicing more proactive behaviors versus reactive behaviors, decrease her overthinking, remain in the present focusing on what she can control her change, staying in contact with people who are supportive, emphasizing her on strengths and positives, and recognize the strength she shows working with goal-directed behaviors to move in a direction that supports her improved emotional health and overall wellbeing.  Goal review and progress/challenges noted with patient.   Next appointment within 3 weeks.    This record has been created using [employer].  Chart creation errors have been sought, but may not always have been located and corrected.  Such creation errors do not reflect on the standard of medical care  provided.   Mathis Fare, LCSW

## 2022-07-21 ENCOUNTER — Ambulatory Visit (INDEPENDENT_AMBULATORY_CARE_PROVIDER_SITE_OTHER): Admitting: Psychiatry

## 2022-07-21 DIAGNOSIS — F411 Generalized anxiety disorder: Secondary | ICD-10-CM | POA: Diagnosis not present

## 2022-07-21 NOTE — Progress Notes (Signed)
[facility] Counselor/Therapist Progress Note  Patient ID: Denise Liu, MRN: [id number],    Date: 07/21/2022  Time Spent: 55 minutes   Treatment Type: Individual Therapy  Reported Symptoms: anxiety  Mental Status Exam:  Appearance:   Neat     Behavior:  Appropriate, Sharing, and Motivated  Motor:  Normal  Speech/Language:   Clear and Coherent  Affect:  anxious  Mood:  anxious  Thought process:  goal directed  Thought content:    Some obsessive thoughts; some ruminating  Sensory/Perceptual disturbances:    WNL  Orientation:  oriented to person, place, time/date, situation, day of week, month of year, year, and stated date of Nov. 20, 2023  Attention:  Fair  Concentration:  Fair  Memory:  WNL  Fund of knowledge:   Good  Insight:    Good  Judgment:   Good  Impulse Control:  Good and Fair   Risk Assessment: Danger to Self:  No Self-injurious Behavior: No Danger to Others: No Duty to Warn:no Physical Aggression / Violence:No  Access to Firearms a concern: No  Gang Involvement:No   Subjective:  Patient in today reporting symptoms of anxiety mostly regarding family and relationship with sister, who has physical and emotional challenges and is dependent on patient for most of her needs and transportation. Patient has been trying to set healthier boundaries with sister to help sister but not let it get overwhelming, however things "have gotten some worse with sister being more overly dependent  on patient and patient finding it difficult to say "no". Worked on this today and patient did feel some empowerment as well as realizing how she needs to be able to able to say "no" and set healthier limits with sister and others at times. Plans to practice setting a few more limits so as to not get overwhelmed by requests of her sister. Not as tired, energy, motivation improved. Trying to create healthier boundaries with sister and others. Less guilt. Less negative self talk. Patent  and husband continue to adjust to retirement. Shared and processed some of the issues that have arisen for her since they both retired and sees herself making some progress. Still some procrastinating on task at home but improving gradually. Seeing some of her progress and feeling encouraged.   Interventions: Cognitive Behavioral Therapy and Ego-Supportive  Long Term goal: Develop healthy cognitive patterns and beliefs about self and the world that lead to alleviation and help to prevent relapse of depression or anxiety. Short Term goal: Identify and replace depressive/anxious thinking that leads to depressive/anxious feelings and actions.Elevate her motivation and self-esteem.  Strategy: Educate the patient about cognitive restructuring including self-monitoring of automatic thoughts reflecting depressive/negative, and anxious beliefs   Diagnosis:   ICD-10-CM   1. Generalized anxiety disorder  F41.1      Plan:  Patient today actively participating in session as she worked on her anxiety, limit setting with sister and others, healthier boundaries, positive self-talk and self-care, and note at least 1 positive trait about herself each day. Some increased motivation.  Less self-inflicted guilt. To continue working with these same behaviors as seh is eager to see more change. Encouraged patient in practicing more positive behaviors as noted in session including: Continuing her work with goal-directed behaviors, positive self talk, refrain from making negative assumptions, healthy boundaries with others, noticed progress already made, saying no when she needs to say no without guilt, interrupt her being judgmental with herself, healthy nutrition and exercise, making best choices  for herself, working with negative automatic thoughts and trying to challenge them to be more self accepting, decreased overthinking, look for more positives versus negatives daily, practice more proactive behaviors versus  reactive behaviors, stay in the present focusing on what she can control or change, remain in contact with people who are supportive, recognize and emphasize her strengths, and realize the strength she shows working with goal-directed behaviors to move in a direction that supports her improved emotional health.  Goal review and progress/challenges noted with patient.  Next appointment within 3 weeks.  This record has been created using [employer].  Chart creation errors have been sought, but may not always have been located and corrected.  Such creation errors do not reflect on the standard of medical care provided.   Mathis Fare, LCSW

## 2022-07-31 ENCOUNTER — Ambulatory Visit: Admitting: Physician Assistant

## 2022-08-01 ENCOUNTER — Encounter: Payer: Self-pay | Admitting: Physician Assistant

## 2022-08-01 ENCOUNTER — Ambulatory Visit (INDEPENDENT_AMBULATORY_CARE_PROVIDER_SITE_OTHER): Admitting: Physician Assistant

## 2022-08-01 DIAGNOSIS — F330 Major depressive disorder, recurrent, mild: Secondary | ICD-10-CM | POA: Diagnosis not present

## 2022-08-01 DIAGNOSIS — F411 Generalized anxiety disorder: Secondary | ICD-10-CM

## 2022-08-01 MED ORDER — FLUVOXAMINE MALEATE 100 MG PO TABS: 200.0000 mg | ORAL_TABLET | Freq: Every day | ORAL | 1 refills | Status: DC

## 2022-08-01 NOTE — Progress Notes (Signed)
[facility] Med Check  Patient ID: Denise Liu,  MRN: [id number]  PCP: Windy Carina, PA-C (Inactive)  Date of Evaluation: 08/01/2022 Time spent:20 minutes  Chief Complaint:  Chief Complaint   Anxiety; Depression; Follow-up    HISTORY/CURRENT STATUS: HPI For routine f/u.   Doing well with mental health meds. Patient is able to enjoy things. Motivation is fair most of the time. Is retired, she watches tv a lot.   Gets together with friends maybe once a week or so.  No extreme sadness, tearfulness, or feelings of hopelessness.  Sleeps well most of the time. ADLs and personal hygiene are normal.   Denies any changes in concentration, making decisions, or remembering things.  Appetite has not changed.  Weight is stable. Denies suicidal or homicidal thoughts.  Does have anxiety occasionally.  He takes the hydroxyzine if absolutely necessary.  It does cause drowsiness so she does not like to take it often.  Patient denies increased energy with decreased need for sleep, increased talkativeness, racing thoughts, impulsivity or risky behaviors, increased spending, increased libido, grandiosity, increased irritability or anger, paranoia, or hallucinations.  Denies dizziness, syncope, seizures, numbness, tingling, tremor, tics, unsteady gait, slurred speech, confusion. Denies muscle or joint pain, stiffness, or dystonia.  Individual Medical History/ Review of Systems: Changes? :Yes   has diabetes.  Hemoglobin A1c is better with recent labs.  Past medications for mental health diagnoses include: Topamax for migraines, modafinil, Zoloft, gabapentin she did not like the way it made her feel, Wellbutrin XL,   Allergies: Codeine, Wellbutrin [bupropion], and Topamax [topiramate]  Current Medications:  Current Outpatient Medications:    atorvastatin (LIPITOR) 10 MG tablet, Take 10 mg by mouth daily., Disp: , Rfl:    cyclobenzaprine (FLEXERIL) 10 MG tablet, Take 10 mg by mouth 3 (three) times  daily as needed for muscle spasms., Disp: , Rfl:    fluticasone (FLONASE) 50 MCG/ACT nasal spray, 1 spray by Each Nare route daily., Disp: , Rfl:    hydrOXYzine (ATARAX) 10 MG tablet, TAKE 1 TO 2 TABLETS THREE TIMES A DAY AS NEEDED, Disp: 270 tablet, Rfl: 1   levothyroxine (SYNTHROID) 25 MCG tablet, Take by mouth., Disp: , Rfl:    lidocaine (LIDODERM) 5 %, Place 1 patch onto the skin daily. Remove & Discard patch within 12 hours or as directed by MD, Disp: , Rfl:    losartan (COZAAR) 25 MG tablet, Take 25 mg by mouth daily., Disp: , Rfl:    metFORMIN (GLUCOPHAGE-XR) 500 MG 24 hr tablet, Take 500 mg by mouth daily., Disp: , Rfl:    naproxen sodium (ANAPROX) 220 MG tablet, Take 220 mg by mouth as needed., Disp: , Rfl:    omeprazole (PRILOSEC) 20 MG capsule, TAKE 1 CAPSULE DAILY, Disp: , Rfl:    b complex vitamins capsule, Take 1 capsule by mouth daily. (Patient not taking: Reported on 08/01/2022), Disp: , Rfl:    calcium carbonate 200 MG capsule, Take 600 mg by mouth daily. (Patient not taking: Reported on 08/01/2022), Disp: , Rfl:    Cholecalciferol (VITAMIN D3) 2000 UNITS TABS, Take 4,000 Int'l Units by mouth. (Patient not taking: Reported on 08/01/2022), Disp: , Rfl:    co-enzyme Q-10 50 MG capsule, Take 300 mg by mouth daily. (Patient not taking: Reported on 08/01/2022), Disp: , Rfl:    fluvoxaMINE (LUVOX) 100 MG tablet, Take 2 tablets (200 mg total) by mouth at bedtime., Disp: 180 tablet, Rfl: 1   Glucosamine-Chondroit-Vit C-Mn (GLUCOSAMINE 1500 COMPLEX PO), Take by mouth. (  Patient not taking: Reported on 09/12/2021), Disp: , Rfl:    Magnesium 250 MG TABS, Take by mouth. (Patient not taking: Reported on 08/01/2022), Disp: , Rfl:    MANGANESE PO, Take by mouth. (Patient not taking: Reported on 01/29/2022), Disp: , Rfl:    Misc Natural Products (TART CHERRY ADVANCED PO), Take by mouth. (Patient not taking: Reported on 01/29/2022), Disp: , Rfl:    Multiple Vitamins-Minerals (MULTIVITAMIN & MINERAL PO),  Take by mouth. (Patient not taking: Reported on 08/01/2022), Disp: , Rfl:    potassium chloride SA (KLOR-CON) 20 MEQ tablet, Take by mouth. (Patient not taking: Reported on 08/01/2022), Disp: , Rfl:    traMADol (ULTRAM) 50 MG tablet, Take 50 mg by mouth every 6 (six) hours as needed for pain. (Patient not taking: Reported on 03/20/2020), Disp: , Rfl:  Medication Side Effects: none  Family Medical/ Social History: Changes? No  MENTAL HEALTH EXAM:  There were no vitals taken for this visit.There is no height or weight on file to calculate BMI.  General Appearance: Casual, Neat, Well Groomed and Obese  Eye Contact:  Good  Speech:  Clear and Coherent and Normal Rate  Volume:  Normal  Mood:  Euthymic  Affect:  Congruent  Thought Process:  Goal Directed and Descriptions of Associations: Intact  Orientation:  Full (Time, Place, and Person)  Thought Content: Logical   Suicidal Thoughts:  No  Homicidal Thoughts:  No  Memory:  WNL  Judgement:  Good  Insight:  Good  Psychomotor Activity:  Normal  Concentration:  Concentration: Good  Recall:  Good  Fund of Knowledge: Good  Language: Good  Assets:  Desire for Improvement Financial Resources/Insurance Housing Transportation  ADL's:  Intact  Cognition: WNL  Prognosis:  Good   DIAGNOSES:    ICD-10-CM   1. Generalized anxiety disorder  F41.1     2. 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  No results available. I provided 20 minutes of face to face time during this encounter, including time spent before and after the visit in records review, medical decision making, counseling pertinent to today's visit, and charting.   From a mental health standpoint she is doing well so no change will be made. Continue Luvox 100 mg, 2 po qhs.  Continue hydroxyzine 10 mg, 1-2 3 times daily as needed anxiety. Continue therapy with Rockne Menghini,  LCSW. Return in 6 months.  Melony Overly, PA-C

## 2022-08-19 ENCOUNTER — Ambulatory Visit (INDEPENDENT_AMBULATORY_CARE_PROVIDER_SITE_OTHER): Admitting: Psychiatry

## 2022-08-19 DIAGNOSIS — F411 Generalized anxiety disorder: Secondary | ICD-10-CM | POA: Diagnosis not present

## 2022-08-19 NOTE — Progress Notes (Signed)
[facility] Counselor/Therapist Progress Note  Patient ID: Denise Liu, MRN: [id number],    Date: 08/19/2022  Time Spent: 55 minutes   Treatment Type: Individual Therapy  Reported Symptoms: anxiety, depression  Mental Status Exam:  Appearance:   Casual and Neat     Behavior:  Appropriate, Sharing, and Motivated  Motor:  Normal  Speech/Language:   Clear and Coherent  Affect:  Anxious, depressed  Mood:  anxious and depressed  Thought process:  goal directed  Thought content:    WNL  Sensory/Perceptual disturbances:    WNL  Orientation:  oriented to person, place, time/date, situation, day of week, month of year, year, and stated date of Dec.19, 2023  Attention:  Good  Concentration:  Good and Fair  Memory:  WNL  Fund of knowledge:   Good  Insight:    Good  Judgment:   Good  Impulse Control:  Good and Fair   Risk Assessment: Danger to Self:  No Self-injurious Behavior: No Danger to Others: No Duty to Warn:no Physical Aggression / Violence:No  Access to Firearms a concern: No  Gang Involvement:Yes   Subjective: Patient in today reporting anxiety and depression, mostly related to family but "affects me a lot." Daughter graduated from nursing school last week. Husband fell in parking lot and hurt nose, foot, leg, and neck. Drove back home and they stopped at ED and got x-rays and CT scan, but "did not show any breaks or other significant injuries". Was able to get med for pain. Later patient received a call and learned her sister was hospitalized with Covid and was unable to walk. Patient quite stressed and was able to focus also today on her own emotional and physical health. Explored/suggested patient setting healthier limits and boundaries, take "time outs" as needed, trying not to overthink nor overanalyze, being able to say "No" as needed, healthy nutrition and exercise, and recognizing her positives without excessive and unreasonable expectations." Some guilt when  she can't do everything asked and talked through this more today. Not feeling quite as guilty when she tries to set some limits.                         Interventions: Cognitive Behavioral Therapy, Solution-Oriented/Positive Psychology, and Ego-Supportive  Long Term goal: Develop healthy cognitive patterns and beliefs about self and the world that lead to alleviation and help to prevent relapse of depression or anxiety. Short Term goal: Identify and replace depressive/anxious thinking that leads to depressive/anxious feelings and actions.Elevate her motivation and self-esteem.  Strategy: Educate the patient about cognitive restructuring including self-monitoring of automatic thoughts reflecting depressive/negative, and anxious beliefs  Diagnosis:   ICD-10-CM   1. Generalized anxiety disorder  F41.1      Plan:  Patient actively participating in the in session today focusing more on some family issues and setting of more appropriate boundaries which has been very difficult for patient.  Is getting more to the point where she can understand if she does not set boundaries she will "burnout" and trying to help her sister.  Was able to work very effectively in session and coming up with some boundaries which she felt was fair and could help her maintain her own self-care also trying to help her sister.  Patient feels that as she begins to practice these it will hopefully become easier.  Less guilt. Encouraged patient in her practice of more positive behaviors as noted in session including: Positive self  talk, refrain from making negative assumptions, healthy boundaries with others, saying no when she needs to say no without guilt, interrupt her being judgmental with herself, healthy nutrition and exercise, making best choices for herself, working with negative automatic thoughts and trying to challenge them to be more self excepting, decrease her overthinking and over analyzing, look for more positives  versus negatives daily, practice more proactive behaviors versus reactive behaviors, stay in the present focusing on what she can control or change, remain in contact with people who are supportive, recognize and emphasize her strengths, and recognize the strength she shows working with goal-directed behaviors to move in a direction that supports her improved emotional health and overall outlook.  Goal review and progress/challenges noted with patient.  Next appointment within 3 weeks.  This record has been created using [employer].  Chart creation errors have been sought, but may not always have been located and corrected.  Such creation errors do not reflect on the standard of medical care provided.   Mathis Fare, LCSW

## 2022-09-09 ENCOUNTER — Ambulatory Visit (INDEPENDENT_AMBULATORY_CARE_PROVIDER_SITE_OTHER): Admitting: Psychiatry

## 2022-09-09 DIAGNOSIS — F411 Generalized anxiety disorder: Secondary | ICD-10-CM | POA: Diagnosis not present

## 2022-09-09 NOTE — Progress Notes (Signed)
[facility] Counselor/Therapist Progress Note  Patient ID: Denise Liu, MRN: [id number],    Date: 09/09/2022  Time Spent: 55 minutes   Treatment Type: Individual Therapy  Reported Symptoms: anxiety, some sadness, self-esteem issues  Mental Status Exam:  Appearance:   Casual     Behavior:  Appropriate, Sharing, and Motivated  Motor:  Normal  Speech/Language:   Clear and Coherent  Affect:  anxiety  Mood:  anxious  Thought process:  goal directed  Thought content:    WNL  Sensory/Perceptual disturbances:    WNL  Orientation:  oriented to person, place, time/date, situation, day of week, month of year, year, and stated date of Sep 09, 2022  Attention:  Good  Concentration:  Good  Memory:  WNL  Fund of knowledge:   Good  Insight:    Good  Judgment:   Good  Impulse Control:  Good   Risk Assessment: Danger to Self:  No Self-injurious Behavior: No Danger to Others: No Duty to Warn:no Physical Aggression / Violence:No  Access to Firearms a concern: No  Gang Involvement:No   Subjective:  Patient today in session reporting anxiety as main symptom. Anxiety continues to be mostly family-related including husband and sister. Some sadness re: family/personal also processed in session. Sister recovering from Covid at a nursing facility currently; continues to be demanding of patient who is recognizing her own guilt and also that her sister doesn't "mean all that she says".  Helping sister look into "[facility]" locally as the sister is wanting to check it out as she has been unhappy in current place before having to go to nursing unit. Working further today on boundary issues without feeling guilty, and learning that it's ok to say "no" to some things, especially for her own best health. "Hard to put herself first sometimes". Trying to reframe things to focus on more setting of limits "with self-expectations" and "expectations of others, "realizing there's no need I have to  do everything all at once." What really bothers me too "is that husband is Always asking me to do things for me, almost not letting me do anything for myself, when I'm wanting to do for myself and I fear ending up being like my sister." Recognizing her positives more which is a good thing, as patient smiled more and affect brightened.   Interventions: Cognitive Behavioral Therapy, Solution-Oriented/Positive Psychology, and Ego-Supportive  Long Term goal: Develop healthy cognitive patterns and beliefs about self and the world that lead to alleviation and help to prevent relapse of depression or anxiety. Short Term goal: Identify and replace depressive/anxious thinking that leads to depressive/anxious feelings and actions.Elevate her motivation and self-esteem.  Strategy: Educate the patient about cognitive restructuring including self-monitoring of automatic thoughts reflecting depressive/negative, and anxious beliefs   Diagnosis:     ICD-10-CM   1. Generalized anxiety disorder  F41.1      Plan:  Patient today showing good motivation and participation in session as she worked further on setting more clear limits and boundaries with others including some family members.  Able to show him self-care and self love which is definite progress for patient.  Will continue working on this in sessions and outside of sessions as these are core issues for this patient currently.  Did some practice with both boundary setting and self-care in session today and she responded more openly and sometimes in the past.  Grasping a better understanding of why she needs self-care and self love as  she continues with goal-directed behaviors to move forward and healthier and happier directions. Encouraged patient in practicing more positive behaviors as discussed in session including: Consistent positive self talk, refrain from making negative assumptions, healthy boundaries with others, saying no when she needs to say no  without guilt, interrupt her being judgmental with herself, healthy nutrition and exercise, working with negative automatic thoughts and trying to challenge them to be more self accepting, decrease her overthinking over analyzing, looking for more positives versus negatives daily, practice more proactive behaviors versus reactive behaviors, stay in the present focusing on what she can control or change, staying in contact with people who are supportive, recognize and emphasize her strengths, and realize the strength she shows working with goal-directed behaviors to move in a direction that supports her improved emotional health and overall wellbeing.  Goal review and progress/challenges noted with patient.  Next appointment within 3 weeks.  This record has been created using [employer].  Chart creation errors have been sought, but may not always have been located and corrected.  Such creation errors do not reflect on the standard of medical care provided.   Mathis Fare, LCSW

## 2022-09-30 ENCOUNTER — Ambulatory Visit: Admitting: Psychiatry

## 2022-09-30 DIAGNOSIS — F411 Generalized anxiety disorder: Secondary | ICD-10-CM

## 2022-09-30 NOTE — Progress Notes (Signed)
[facility] Counselor/Therapist Progress Note  Patient ID: Denise Liu, MRN: [id number],    Date: 09/30/2022  Time Spent: 50 minutes   Treatment Type: Individual Therapy  Reported Symptoms: anxiety  Mental Status Exam:  Appearance:   Casual     Behavior:  Appropriate, Sharing, and Motivated  Motor:  Normal  Speech/Language:   Clear and Coherent  Affect:  anxious  Mood:  anxious  Thought process:  goal directed  Thought content:    Some rumination  Sensory/Perceptual disturbances:    WNL  Orientation:  oriented to person, place, time/date, situation, day of week, month of year, year, and stated date of Sep 30, 2022  Attention:  Fair  Concentration:  Fair  Memory:  [place] of knowledge:   Good  Insight:    Good  Judgment:   Good  Impulse Control:  Good   Risk Assessment: Danger to Self:  No Self-injurious Behavior: No Danger to Others: No Duty to Warn:no Physical Aggression / Violence:No  Access to Firearms a concern: No  Gang Involvement:No   Subjective:   Patient in today reporting anxiety mostly related to her aging sister with health issues as she is dealing with current crisis of her sister needing to leave her current medical facility and moving to another location. Patient overwhelmed with all that is happening currently with her sister as patient is the person who oversees her sister's care and her current transition.  Patient shared an update on her sister and how this is all impacting her, but was also able to think through what she needed to do first and then the steps that would follow after that.  Is thinking through things "more calmly now" after venting freely in session today. Able to prioritize her needs for her own self-care in addition to her sister's needs, patient reports being helpful to her.  Continue to work on her boundary issues from previous sessions and being able to say more comfortably, "no" when she is needing to set limits with  others.  Continues to work on setting limits with others and that "I do not have to do everything at one time".  Expectations of herself, she is working on to get the more realistic.  Interventions: Cognitive Behavioral Therapy and Ego-Supportive  Long Term goal: Develop healthy cognitive patterns and beliefs about self and the world that lead to alleviation and help to prevent relapse of depression or anxiety. Short Term goal: Identify and replace depressive/anxious thinking that leads to depressive/anxious feelings and actions.Elevate her motivation and self-esteem.  Strategy: Educate the patient about cognitive restructuring including self-monitoring of automatic thoughts reflecting depressive/negative, and anxious beliefs  Diagnosis:   ICD-10-CM   1. Generalized anxiety disorder  F41.1      Plan:  Patient today actively participating in session with good motivation as she worked on [employer] and helping with her sister's current situation as she is her main contact and resource for helping her aging sister.  As noted above, patient did some really good work in processing her anxiety and realizing that some of her own expectations were too high for herself and needed adjustment which she is working on currently.  Also looking at her own needs and how to best get them met.  Able to experience significant reduction in her anxiety by talking through her concerns and coming up with a reasonable plan to help her make the decision she needs to make for her sister and yet  also take care of her own needs.  Working on her ability to have more self love and healthier boundaries.  Progressing and needs to continue working with goal-directed behaviors to keep moving and in a positive direction. Encouraged patient in her practice of more positive behaviors as discussed in session including: Positive self talk, decrease negative assumptions, healthy boundaries with others, interrupt her being  judgmental with herself, healthy nutrition and exercise, working with negative automatic thoughts and trying to challenge them to be more self accepting, decrease overthinking and over analyzing, look for more positives versus negatives each day, practice more proactive behaviors versus reactive, stay in the present focusing on what she can control or change, remain in contact with supportive people, recognize and emphasize her strengths, and recognize the strength she shows working with goal-directed behaviors to move in a direction that supports her improved emotional health and outlook.  Goal review and progress/challenges noted with patient.  Next appointment within 2 to 3 weeks.  This record has been created using [employer].  Chart creation errors have been sought, but may not always have been located and corrected.  Such creation errors do not reflect on the standard of medical care provided.   Shanon Ace, LCSW

## 2022-10-21 ENCOUNTER — Ambulatory Visit: Admitting: Psychiatry

## 2022-10-21 DIAGNOSIS — F411 Generalized anxiety disorder: Secondary | ICD-10-CM | POA: Diagnosis not present

## 2022-10-21 NOTE — Progress Notes (Signed)
[facility] Counselor/Therapist Progress Note  Patient ID: Denise Liu, MRN: [id number],    Date: 10/21/2022  Time Spent: 50 minutes   Treatment Type: Individual Therapy  Reported Symptoms: anxiety  Mental Status Exam:  Appearance:   Casual and Neat     Behavior:  Appropriate, Sharing, and Motivated  Motor:  Normal  Speech/Language:   Clear and Coherent  Affect:  anxious  Mood:  anxious  Thought process:  goal directed  Thought content:    WNL  Sensory/Perceptual disturbances:    WNL  Orientation:  oriented to person, place, time/date, situation, day of week, month of year, year, and stated date of Feb. 20, 2024  Attention:  Good  Concentration:  Good  Memory:  WNL  Fund of knowledge:   Good  Insight:    Good  Judgment:   Good  Impulse Control:  Good and Fair   Risk Assessment: Danger to Self:  No Self-injurious Behavior: No Danger to Others: No Duty to Warn:no Physical Aggression / Violence:No  Access to Firearms a concern: No  Gang Involvement:No   Subjective: Patient in today reporting some depression but more anxiety mostly related to personal, family (sister), and upcoming involvement in a conference.  Was able to talk through some of the personal and family issues to gain more clarity about each of them and also to help prioritize some things that need to be addressed first. Overseeing older sister's healthcare needs and helped her get adjusted after having moved recently to different apartment. Patient feeling more settled now that her sister is feeling more comfortable at new place.  Did encourage patient to pace herself as she reported feeling much more tired after getting her sister moved. Spending time with young grandson from [place] which patient really enjoys. States that she feels she and husband are settling in better to retirement.  Husband having hearing issues but won't consider hearing aides yet. Affects their communication as a couple.   Explored with patient some different ways of supporting husband with his hearing difficulties that he might accept in a different way and possibly consider at least getting his hearing tested, and hopefully follow through with what ever medical personnel there suggest that might be of help to him.  Discussed the anxiety she feels related to her sister's care, her own health now and into the future, and the anxiety related to world issues that can "be scary and feel out of control."  Continued work on appropriate boundaries with others and understanding that she does not "have to do everything at once."  Acknowledges that she continues working on trying to make expectations of herself more realistic and finds that prioritizing helps.  Interventions: Cognitive Behavioral Therapy and Ego-Supportive  Long Term goal: Develop healthy cognitive patterns and beliefs about self and the world that lead to alleviation and help to prevent relapse of depression or anxiety. Short Term goal: Identify and replace depressive/anxious thinking that leads to depressive/anxious feelings and actions.Elevate her motivation and self-esteem.  Strategy: Educate the patient about cognitive restructuring including self-monitoring of automatic thoughts reflecting depressive/negative, and anxious beliefs  Diagnosis:   ICD-10-CM   1. Generalized anxiety disorder  F41.1      Plan: Patient showing good motivation and participation today in session as she worked on anxiety, self-care, and communication within the family especially her husband about sensitive issues.  His patient is continuing to show progress and needs to follow through with goal-directed behaviors to keep moving forward  in a positive direction.  Seems to be noticing more of her progress at this point.  Encouraged patient in her practice of more self affirming and positive behaviors as noted in session including: Positive self talk, decrease negative assumptions,  healthy boundaries with others, interrupt her being judgmental with herself, healthy nutrition and exercise, working with negative automatic thoughts and trying to challenge them to be more self accepting, decrease overthinking and over analyzing, look for more positives versus negatives each day, practice more proactive behaviors versus reactive, stay in the present focusing on what she can change or control, remain in contact with supportive people, recognize and emphasize her strengths, and realize the strength she shows working with goal-directed behaviors to move in a direction that supports her improved emotional health and outlook.  Review and progress/challenges noted with patient.  Next appointment within 3 weeks.  This record has been created using [employer].  Chart creation errors have been sought, but may not always have been located and corrected.  Such creation errors do not reflect on the standard of medical care provided.   Shanon Ace, LCSW

## 2022-11-11 ENCOUNTER — Ambulatory Visit (INDEPENDENT_AMBULATORY_CARE_PROVIDER_SITE_OTHER): Admitting: Psychiatry

## 2022-12-09 ENCOUNTER — Ambulatory Visit (INDEPENDENT_AMBULATORY_CARE_PROVIDER_SITE_OTHER): Admitting: Psychiatry

## 2022-12-09 DIAGNOSIS — F411 Generalized anxiety disorder: Secondary | ICD-10-CM | POA: Diagnosis not present

## 2022-12-09 NOTE — Progress Notes (Signed)
[facility] Counselor/Therapist Progress Note  Patient ID: Denise Liu, MRN: [id number],    Date: 12/09/2022  Time Spent: 55 minutes   Treatment Type: Individual Therapy  Reported Symptoms: anxiety increased past 2 weeks, hard to get things done, little things "get on my nerves", bad dreams  Mental Status Exam:  Appearance:   Casual     Behavior:  Appropriate, Sharing, and Motivated  Motor:  Normal  Speech/Language:   Clear and Coherent  Affect:  anxious  Mood:  anxious  Thought process:  goal directed  Thought content:    Some obsessive thoughts and dreams  Sensory/Perceptual disturbances:    WNL  Orientation:  oriented to person, place, time/date, situation, day of week, month of year, year, and stated date of December 09, 2022  Attention:  Good  Concentration:  Fair and Poor  Memory:  WNL  Fund of knowledge:   Good  Insight:    Good  Judgment:   Good  Impulse Control:  Good and Fair   Risk Assessment: Danger to Self:  No Self-injurious Behavior: No Danger to Others: No Duty to Warn:no Physical Aggression / Violence:No  Access to Firearms a concern: No  Gang Involvement:No   Subjective:   Patient in today reporting increase in anxiety past 2 weeks and feels some is related to retired husband being home all the time now, her upcoming talk presentation at a conference, and bad dreams. "The least little things get on my nerves and not sure why."  Explore this more in session and seem to think that the above-mentioned stressors have a lot to do with her mood currently, especially the increased anxiety.  Talked through these issues in session today and gained a lot of clarity as to the stress that comes with them as well as some issues of her own self-esteem being tapped.  She also noted other life situations that are feeling her increased anxiety including older sisters health care needs and patient being the one to help make decisions with sister.  Reports that she and  her husband are still going through an adjustment.  Of husband's retirement and his being at home during the day a whole lot more than previously.  Self-esteem also a factor for patient and she did well and talking through each of these different areas today and able to see their influence in her life especially right now, as well as look at some thoughts and behaviors that she can use to better manage the stressors.  Encouraging patient to pace herself more.  Also working on her improved understanding that she "does not have to do everything at once".  Continue to work on healthy boundaries with others and keeping the expectations of herself healthy and realistic.  Interventions: Cognitive Behavioral Therapy and Ego-Supportive  Long Term goal: Develop healthy cognitive patterns and beliefs about self and the world that lead to alleviation and help to prevent relapse of depression or anxiety. Short Term goal: Identify and replace depressive/anxious thinking that leads to depressive/anxious feelings and actions.Elevate her motivation and self-esteem.  Strategy: Educate the patient about cognitive restructuring including self-monitoring of automatic thoughts reflecting depressive/negative, and anxious beliefs   Diagnosis:   ICD-10-CM   1. Generalized anxiety disorder  F41.1      Plan:  Patient today actively participating in session working on her anxiety, stress, self-negating, and looking for what might go right versus wrong. Motivation improved over course of session. Is making progress. Needs to continue  working with goal-directed behaviors to keep moving in a forward direction. Encouraged patient in practicing more self affirming and positive behaviors as noted in session including: Decrease negative assumptions, interrupt her being judgmental with herself, positive self talk, healthy boundaries with others, healthy nutrition and exercise, working with negative automatic thoughts and trying to  challenge them to be more self accepting, decrease overthinking and over analyzing, look for more positives versus negatives daily, practice more proactive behaviors versus reactive, remain in the present focusing on what she can change or control, stay in contact with supportive people, recognize and emphasize her strengths, and realize the strength she shows working with goal-directed behaviors to move in a direction that supports her improved emotional health and overall wellbeing.  Self rating scales: 1-10 depression scale-currently a "3" 1-10 anxiety scale-"9 at beginning and onset of session and is currently a 6/7"   Review and progress/challenges noted with patient.  Next appointment within 3 to 4 weeks.  This record has been created using [employer].  Chart creation errors have been sought, but may not always have been located and corrected.  Such creation errors do not reflect on the standard of medical care provided.   Mathis Fare, LCSW

## 2023-01-06 ENCOUNTER — Ambulatory Visit (INDEPENDENT_AMBULATORY_CARE_PROVIDER_SITE_OTHER): Payer: BC Managed Care – PPO | Admitting: Psychiatry

## 2023-01-06 DIAGNOSIS — F330 Major depressive disorder, recurrent, mild: Secondary | ICD-10-CM

## 2023-01-06 NOTE — Progress Notes (Signed)
[facility] Counselor/Therapist Progress Note  Patient ID: Denise Liu, MRN: [id number],    Date: 01/06/2023  Time Spent: 55 minutes   Treatment Type: Individual Therapy  Reported Symptoms: depression, some anxiety  Mental Status Exam:  Appearance:   Casual     Behavior:  Appropriate, Sharing, and motivation can be challenging  Motor:  Normal  Speech/Language:   Clear and Coherent  Affect:  Depressed and some anxiety  Mood:  anxious and depressed  Thought process:  goal directed  Thought content:    WNL  Sensory/Perceptual disturbances:    WNL  Orientation:  oriented to person, place, time/date, situation, day of week, month of year, year, and stated date of Jan 06, 2023  Attention:  Fair  Concentration:  Fair  Memory:  WNL  Fund of knowledge:   Good  Insight:    Good  Judgment:   Good  Impulse Control:  Good and Fair   Risk Assessment: Danger to Self:  No Self-injurious Behavior: No Danger to Others: No Duty to Warn:no Physical Aggression / Violence:No  Access to Firearms a concern: No  Gang Involvement:No   Subjective:  Patient in today reporting depression and some anxiety mostly related to personal, marital relationship, and some lack of motivation. Has been hibernating more at home and admits that feeds her depression.  Denies any SI.  Does note that when she tends to lie around the house a lot it does impact her mood negatively and motivation decreases.  Processed some changes she can make that will help her mood including getting outside more, calling friends, arranging to meet up with friends, facetime grandkids  1-2 during the week, getting back into activities that she enjoyed at church (she named several ), take more walks in neighborhood. Feels that if she does these, it will make a positive difference, "getting me back in a better routine" and patient commits to following through. Patient made notes during session and seems committed to change.  States  that she wants to see how she does and trying to make some changes and that she is to see her med provider within a month and will talk to her about her meds if she is not feeling better.  Is feeling better about her sister situation where there is a medical person present on the premises and sister seems to be happier.  Still adjusting with husband's retirement and him being home a lot more than he was when he was working.  This seems like it has been an adjustment for both she and husband.  Seem to be feeling some better by end of session and showing a little bit of motivation to follow-up on the behaviors and strategies that we outlined and I had her write out on a paper and take with her.  Interventions: Cognitive Behavioral Therapy and Ego-Supportive  Long Term goal: Develop healthy cognitive patterns and beliefs about self and the world that lead to alleviation and help to prevent relapse of depression or anxiety. Short Term goal: Identify and replace depressive/anxious thinking that leads to depressive/anxious feelings and actions.Elevate her motivation and self-esteem.  Strategy: Educate the patient about cognitive restructuring including self-monitoring of automatic thoughts reflecting depressive/negative, and anxious beliefs  Diagnosis:   ICD-10-CM   1. 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  Has also been isolating herself.  Did seem to have some motivation that improved over the course of session and maybe it was more the fact she was involved 1:1 with someone trying to help her but she did respond well to suggestions and actually took part in naming some changes that she could work on that would likely help her  mood and outlook.  Patient has made progress in the past and needs to continue working with goal-directed behaviors to keep moving in a forward direction.Encouraged patient in her practice of more self affirming and positive behaviors as noted in session including: Decreasing negative assumptions, interrupt her being judgmental with herself, positive self talk, healthy boundaries with others, healthy nutrition and exercise, working with negative automatic thoughts and trying to challenge them to be more self accepting, decrease overthinking and over analyzing, look for more positives versus negatives each day, practice more proactive behaviors versus reactive, stay in the present focusing on what she can change or control, remain in contact with supportive people, and realize and emphasize her strengths, and recognize the strengths she shows working with goal-directed behaviors to move in a direction that supports her improved emotional health and overall outlook.  Goal review and progress/challenges noted with patient.  Next appointment within 3 weeks.  Mathis Fare, LCSW

## 2023-02-02 ENCOUNTER — Ambulatory Visit (INDEPENDENT_AMBULATORY_CARE_PROVIDER_SITE_OTHER): Payer: BC Managed Care – PPO | Admitting: Physician Assistant

## 2023-02-02 ENCOUNTER — Ambulatory Visit (INDEPENDENT_AMBULATORY_CARE_PROVIDER_SITE_OTHER): Payer: BC Managed Care – PPO | Admitting: Psychiatry

## 2023-02-02 ENCOUNTER — Encounter: Payer: Self-pay | Admitting: Physician Assistant

## 2023-02-02 DIAGNOSIS — F99 Mental disorder, not otherwise specified: Secondary | ICD-10-CM

## 2023-02-02 DIAGNOSIS — F411 Generalized anxiety disorder: Secondary | ICD-10-CM

## 2023-02-02 DIAGNOSIS — F331 Major depressive disorder, recurrent, moderate: Secondary | ICD-10-CM

## 2023-02-02 DIAGNOSIS — F5105 Insomnia due to other mental disorder: Secondary | ICD-10-CM | POA: Diagnosis not present

## 2023-02-02 MED ORDER — DULOXETINE HCL 30 MG PO CPEP: 30.0000 mg | ORAL_CAPSULE | Freq: Every day | ORAL | 0 refills | Status: DC

## 2023-02-02 NOTE — Progress Notes (Signed)
[facility] Med Check  Patient ID: Denise Liu,  MRN: [id number]  PCP: Windy Carina, PA-C (Inactive)  Date of Evaluation: 02/02/2023 Time spent:20 minutes  Chief Complaint:  Chief Complaint   Anxiety; Depression; Follow-up    HISTORY/CURRENT STATUS: HPI For routine f/u.   For the past 6 months has had ups and downs with mood.  Not wanting to anything. Sits andwatches tv all day or something. Is retired. Sleep is not great, stays up late, then sleeps until early afternoon. Rarely naps.  Not crying easily. Appetite is the same, no weight gain. Is trying to lose weight and has lost some. Feels full easily.  No anxiety, no SI/HI.   States that attention is good without easy distractibility.  Able to focus on things and finish tasks to completion.   Patient denies increased energy with decreased need for sleep, increased talkativeness, racing thoughts, impulsivity or risky behaviors, increased spending, increased libido, grandiosity, increased irritability or anger, paranoia, or hallucinations.  Denies dizziness, syncope, seizures, numbness, tingling, tremor, tics, unsteady gait, slurred speech, confusion. Denies muscle or joint pain, stiffness, or dystonia.  Individual Medical History/ Review of Systems: Changes? :No     Past medications for mental health diagnoses include: Topamax for migraines, modafinil, Zoloft, gabapentin she did not like the way it made her feel, Wellbutrin XL,   Allergies: Codeine, Wellbutrin [bupropion], and Topamax [topiramate]  Current Medications:  Current Outpatient Medications:    atorvastatin (LIPITOR) 10 MG tablet, Take 10 mg by mouth daily., Disp: , Rfl:    cyclobenzaprine (FLEXERIL) 10 MG tablet, Take 10 mg by mouth 3 (three) times daily as needed for muscle spasms., Disp: , Rfl:    DULoxetine (CYMBALTA) 30 MG capsule, Take 1 capsule (30 mg total) by mouth daily., Disp: 90 capsule, Rfl: 0   fluticasone (FLONASE) 50 MCG/ACT nasal spray, 1 spray  by Each Nare route daily., Disp: , Rfl:    hydrOXYzine (ATARAX) 10 MG tablet, TAKE 1 TO 2 TABLETS THREE TIMES A DAY AS NEEDED, Disp: 270 tablet, Rfl: 1   levothyroxine (SYNTHROID) 25 MCG tablet, Take by mouth., Disp: , Rfl:    lidocaine (LIDODERM) 5 %, Place 1 patch onto the skin daily. Remove & Discard patch within 12 hours or as directed by MD, Disp: , Rfl:    losartan (COZAAR) 25 MG tablet, Take 25 mg by mouth daily., Disp: , Rfl:    naproxen sodium (ANAPROX) 220 MG tablet, Take 220 mg by mouth as needed., Disp: , Rfl:    omeprazole (PRILOSEC) 20 MG capsule, TAKE 1 CAPSULE DAILY, Disp: , Rfl:    TRULICITY 0.75 MG/0.5ML SOPN, Inject into the skin., Disp: , Rfl:    b complex vitamins capsule, Take 1 capsule by mouth daily. (Patient not taking: Reported on 08/01/2022), Disp: , Rfl:    calcium carbonate 200 MG capsule, Take 600 mg by mouth daily. (Patient not taking: Reported on 08/01/2022), Disp: , Rfl:    Cholecalciferol (VITAMIN D3) 2000 UNITS TABS, Take 4,000 Int'l Units by mouth. (Patient not taking: Reported on 08/01/2022), Disp: , Rfl:    co-enzyme Q-10 50 MG capsule, Take 300 mg by mouth daily. (Patient not taking: Reported on 08/01/2022), Disp: , Rfl:    Glucosamine-Chondroit-Vit C-Mn (GLUCOSAMINE 1500 COMPLEX PO), Take by mouth. (Patient not taking: Reported on 09/12/2021), Disp: , Rfl:    Magnesium 250 MG TABS, Take by mouth. (Patient not taking: Reported on 08/01/2022), Disp: , Rfl:    MANGANESE PO, Take by mouth. (Patient not  taking: Reported on 01/29/2022), Disp: , Rfl:    metFORMIN (GLUCOPHAGE-XR) 500 MG 24 hr tablet, Take 500 mg by mouth daily. (Patient not taking: Reported on 02/02/2023), Disp: , Rfl:    Misc Natural Products (TART CHERRY ADVANCED PO), Take by mouth. (Patient not taking: Reported on 01/29/2022), Disp: , Rfl:    Multiple Vitamins-Minerals (MULTIVITAMIN & MINERAL PO), Take by mouth. (Patient not taking: Reported on 08/01/2022), Disp: , Rfl:    potassium chloride SA (KLOR-CON) 20  MEQ tablet, Take by mouth. (Patient not taking: Reported on 08/01/2022), Disp: , Rfl:    traMADol (ULTRAM) 50 MG tablet, Take 50 mg by mouth every 6 (six) hours as needed for pain. (Patient not taking: Reported on 03/20/2020), Disp: , Rfl:  Medication Side Effects: none  Family Medical/ Social History: Changes? No  MENTAL HEALTH EXAM:  There were no vitals taken for this visit.There is no height or weight on file to calculate BMI.  General Appearance: Casual, Neat, Well Groomed and Obese  Eye Contact:  Good  Speech:  Clear and Coherent and Normal Rate  Volume:  Normal  Mood:   sad  Affect:  Congruent  Thought Process:  Goal Directed and Descriptions of Associations: Intact  Orientation:  Full (Time, Place, and Person)  Thought Content: Logical   Suicidal Thoughts:  No  Homicidal Thoughts:  No  Memory:  WNL  Judgement:  Good  Insight:  Good  Psychomotor Activity:  Normal  Concentration:  Concentration: Good  Recall:  Good  Fund of Knowledge: Good  Language: Good  Assets:  Desire for Improvement Financial Resources/Insurance Housing Transportation  ADL's:  Intact  Cognition: WNL  Prognosis:  Good   DIAGNOSES:    ICD-10-CM   1. Major depressive disorder, recurrent episode, moderate (HCC)  F33.1     2. Generalized anxiety disorder  F41.1     3. Insomnia due to other mental disorder  F51.05    F99      Receiving Psychotherapy: Yes   Rockne Menghini, LCSW  RECOMMENDATIONS:  PDMP was reviewed.  No controlled substances.   I provided 20 minutes of face to face time during this encounter, including time spent before and after the visit in records review, medical decision making, counseling pertinent to today's visit, and charting.   Discussed dx and treatment options. I recommend changing to Cymbalta. Benefits, risks, SE discussed and she accepts.   On the Luvox 100 mg decrease to 1.5 pills every night for 5 nights, then 1 pill every night for 5 nights, then 1/2 pill every  night for 5 nights and then stop. At the same time start Cymbalta 30 mg daily for 2 weeks and then increase to 60 mg daily. Continue hydroxyzine 10 mg, 1-2 3 times daily as needed anxiety. Continue therapy with Rockne Menghini, LCSW. Return in 6 weeks.  Melony Overly, PA-C

## 2023-02-02 NOTE — Patient Instructions (Signed)
On the Luvox 100 mg decreased to 1.5 pills every night for 5 nights, then 1 pill every night for 5 nights, then 1/2 pill every night for 5 nights and then stop. At the same time start Cymbalta 30 mg daily for 2 weeks and then increase to 60 mg daily.

## 2023-02-02 NOTE — Progress Notes (Signed)
[facility] Counselor/Therapist Progress Note  Patient ID: Denise Liu, MRN: [id number],    Date: 02/02/2023  Time Spent: 55 minutes   Treatment Type: Individual Therapy  Reported Symptoms: depression increased recently, less motivation, denies SI  Mental Status Exam:  Appearance:   Casual and Neat     Behavior:  Appropriate and Sharing  Motor:  Normal  Speech/Language:   Clear and Coherent  Affect:  Depressed and Flat  Mood:  depressed  Thought process:  goal directed  Thought content:    WNL  Sensory/Perceptual disturbances:    WNL  Orientation:  oriented to person, place, time/date, situation, day of week, month of year, year, and stated date of February 02, 2023  Attention:  Good  Concentration:  Good  Memory:  WNL  Fund of knowledge:   Good  Insight:    Fair  Judgment:   Good  Impulse Control:  Good   Risk Assessment: Danger to Self:  No Self-injurious Behavior: No Danger to Others: No Duty to Warn:no Physical Aggression / Violence:No  Access to Firearms a concern: No  Gang Involvement:No   Subjective: Patient today in session and reports increased depression past 2 weeks and "has no idea why as there's nothing that has happened to cause it." Still attended her church retreat but came home feeling more depressed, and can't think of anything at the retreat that contributed to her depression. Sleep more recently has been irregular and hasn't been keeping consistent bedtime, and acknowledges that may be part of the problem.  Before her retreat she had shared her depressed feelings including working on them in session. Was some better during retreat and with other people. Now back at home after retreats and hibernating, and noticing she's more depressed again. Discussed this at more length and able to see how she unintentionally sets herself up for some negative feelings by some of the choices she is making. Explored ways of re-connecting with people and being more  involved in activities. Some overthinking and overanalyzing which also confronted in session today and seemed helpful to patient. Commits to working more consistently to decrease her overthinking and paying more attention in conversations with other friends.Denies SI. Looked at some ways of being together more with people, refraining from negative self talk, and working towards more self-understanding as discussed in session today.   Interventions: Cognitive Behavioral Therapy and Ego-Supportive  Long Term goal: Develop healthy cognitive patterns and beliefs about self and the world that lead to alleviation and help to prevent relapse of depression or anxiety. Short Term goal: Identify and replace depressive/anxious thinking that leads to depressive/anxious feelings and actions.Elevate her motivation and self-esteem.  Strategy: Educate the patient about cognitive restructuring including self-monitoring of automatic thoughts reflecting depressive/negative, and anxious beliefs   Diagnosis:   ICD-10-CM   1. Major depressive disorder, recurrent episode, moderate (HCC)  F33.1      Plan: Patient in session today and actively participating as she focused further on some anxiety but mostly her depression which has worsened more recently.  She was able to recognize how she has been more "shut down", not as interactive with other people, and hibernating at home more again.  Discussed this in a lot of detail and also worked out some basic behaviors for her to do in between sessions that can get her back out and around people, involved in activities that she has previously enjoyed, and feel better about herself eventually.  Is making progress and needs  to continue her work on goal-directed behaviors so as to move in a forward direction.  Continues to deny any SI. Encouraged patient and practicing more self affirming and positive behaviors as noted in session including: Decreasing her negative assumptions,  interrupt her being judgmental with herself, use of more positive self talk, healthy boundaries with others, healthy nutrition and exercise, working with negative automatic thoughts and trying to challenge them to be more self accepting, decrease overthinking and over analyzing, look more for positives versus negatives each day, practice more proactive behaviors versus reactive, stay in the present focusing on what she can change or control, staying in contact with supportive people, recognize and emphasize her strengths, and realize the strengths she shows working with goal-directed behaviors to move in a direction that supports her improved emotional health and wellbeing.  Goal review and progress/challenges noted with patient.  Next appointment within approx 3 weeks.   Mathis Fare, LCSW

## 2023-02-16 ENCOUNTER — Ambulatory Visit (INDEPENDENT_AMBULATORY_CARE_PROVIDER_SITE_OTHER): Payer: BC Managed Care – PPO | Admitting: Psychiatry

## 2023-02-16 DIAGNOSIS — F331 Major depressive disorder, recurrent, moderate: Secondary | ICD-10-CM | POA: Diagnosis not present

## 2023-02-16 NOTE — Progress Notes (Signed)
[facility] Counselor/Therapist Progress Note  Patient ID: Denise Liu, MRN: [id number],    Date: 02/16/2023  Time Spent:  55 minutes  Treatment Type: Individual Therapy  Reported Symptoms: depression, tired, hard to get out of bed but is getting better and has had a med change that is helping  Mental Status Exam:  Appearance:   Casual     Behavior:  Appropriate, Sharing, and Motivated  Motor:  Normal  Speech/Language:   Clear and Coherent  Affect:  Depressed  Mood:  depressed and some anxiety (some progress noted very gradually)  Thought process:  goal directed  Thought content:    WNL  Sensory/Perceptual disturbances:    WNL  Orientation:  oriented to person, place, time/date, situation, day of week, month of year, year, and stated date of February 16, 2023  Attention:  Fair  Concentration:  Good  Memory:  WNL  Fund of knowledge:   Good  Insight:    Good  Judgment:   Good  Impulse Control:  Good and Fair   Risk Assessment: Danger to Self:  No Self-injurious Behavior: No Danger to Others: No Duty to Warn:no Physical Aggression / Violence:No  Access to Firearms a concern: No  Gang Involvement:No   Subjective:   Patient in session and reporting improvement in mood, motivation, some energy, and "self accountable" and working with new process and keeping calendar. Involved in a class at her church with she enjoys and has been attending. Less depressed. Recognizes that she needs to get out of house more and have better balance. Leading crafts in V[employer] at her church. Has been connecting some better with others and realizing she needs to set healthier limits and not overwhelm herself. Stress within home environment re: husband not saying "no" for former employer about working more in retirement. House and all to be done is stressing her more and contributes to her depression, but now trying to create more specific goals for herself starting small and building up gradually.  Noticeably feeling some better about her cleaning up and organizing at home.Sleep improving some and trying to go to bed earlier. To visit 2 brothers and a sister next week.  "Am participating more in the things I am involved in including helping with VBS and other activities."  More attentive in conversations per her report and continues to work on decreasing her overthinking.  Has been involved in social interactions with people more recently and a definite decrease in negative self talk.  Interventions: Cognitive Behavioral Therapy and Ego-Supportive  Long Term goal: Develop healthy cognitive patterns and beliefs about self and the world that lead to alleviation and help to prevent relapse of depression or anxiety. Short Term goal: Identify and replace depressive/anxious thinking that leads to depressive/anxious feelings and actions.Elevate her motivation and self-esteem.  Strategy: Educate the patient about cognitive restructuring including self-monitoring of automatic thoughts reflecting depressive/negative, and anxious beliefs  Diagnosis:   ICD-10-CM   1. Major depressive disorder, recurrent episode, moderate (HCC)  F33.1      Plan:   Patient in session today motivated and participating as she continues work with her goal-directed behaviors especially developing patterns that help patient in being more positive about herself and the world and have more focus and tools she is working on to better manage anxiety.  Increased effort more recently.  She is making progress and needs to continue working with goal-directed behaviors in order to move in a forward direction.Encouraged patient in her practice  of more positive and self affirming behaviors as discussed in sessions including: Decreasing negative assumptions especially about herself, interrupt her being judgmental with self, positive self talk, healthy boundaries with others, healthy nutrition and exercise, working with negative automatic  thoughts and trying to challenge them to be more self accepting, decrease overthinking and over analyzing, look more for positives versus negatives daily, practice more proactive behaviors versus reactive, stay in the present focusing what she can change or control, remain in contact with supportive people, identify and emphasize her strengths, and recognize the progress she shows working with goal-directed behaviors to move in a direction that supports her improved emotional health and outlook.  Self rating scales: 1-10 depression scale-5 1-10 anxiety scale-4 1-10 self-esteem scale-6 1-10 motivation scale-5 1-10 hopefulness scale-7  Goal review and progress/challenges noted with patient.  Next appointment within 2 to 3 weeks.   Mathis Fare, LCSW

## 2023-03-10 ENCOUNTER — Ambulatory Visit (INDEPENDENT_AMBULATORY_CARE_PROVIDER_SITE_OTHER): Payer: Medicare HMO | Admitting: Psychiatry

## 2023-03-10 DIAGNOSIS — F411 Generalized anxiety disorder: Secondary | ICD-10-CM

## 2023-03-10 NOTE — Progress Notes (Signed)
[facility] Counselor/Therapist Progress Note  Patient ID: Denise Liu, MRN: [id number],    Date: 03/10/2023  Time Spent: 53 minutes  Treatment Type: Individual Therapy  Reported Symptoms: anxiety,depression, obsessive thoughts about family and herself  Mental Status Exam:  Appearance:   Casual and Neat     Behavior:  Appropriate, Sharing, and Motivated  Motor:  Normal  Speech/Language:   Clear and Coherent  Affect:  Depressed and anxious  Mood:  anxious and depressed  Thought process:  goal directed  Thought content:    Obsessive thoughts  Sensory/Perceptual disturbances:    WNL  Orientation:  oriented to person, place, time/date, situation, day of week, month of year, year, and stated date of March 10, 2023  Attention:  Good  Concentration:  Good  Memory:  WNL  Fund of knowledge:   Good  Insight:    Fair  Judgment:   Good  Impulse Control:  Good and Fair   Risk Assessment: Danger to Self:  No Self-injurious Behavior: No Danger to Others: No Duty to Warn:no Physical Aggression / Violence:No  Access to Firearms a concern: No  Gang Involvement:No   Subjective: Patient in today and needing to work on some personal and family issues, including feelings between her and others in family and this surfaced more when they were on vacation recently.  "I know I need to not stress about it."  Unresolved issues within family and patient found it hard to ignore them while with family recently in [place], specially with younger sister. Significant issues (patient feels) within relationships with other family members because "I didn't attend my mother's funeral." Vacation trip to [place] led to increased feelings of separation and not fitting in. Admits her own negative thoughts are a big part of the problem along with self-pity and needing to keep myself busy so I don't think so much." Patient feels, and has felt badly about missing mom's funeral, and processed this more today and  patient did arrive at a place of acceptance of herself and trying to have a place in her heart where she feels comfortable and more at peace with mother's death which was very difficult for patient.  Encouraged patient to continue her efforts to allow for better sleep. (not all details included in this note due to patient privacy needs.)  Interventions: Cognitive Behavioral Therapy and Ego-Supportive  Long Term goal: Develop healthy cognitive patterns and beliefs about self and the world that lead to alleviation and help to prevent relapse of depression or anxiety. Short Term goal: Identify and replace depressive/anxious thinking that leads to depressive/anxious feelings and actions.Elevate her motivation and self-esteem.  Strategy: Educate the patient about cognitive restructuring including self-monitoring of automatic thoughts reflecting depressive/negative, and anxious beliefs  Diagnosis:   ICD-10-CM   1. Generalized anxiety disorder  F41.1      Plan: Patient in session and working on some old hurts that are involved in some of her current anxiety and depression.  Was very open in session today and touched on some areas that were very sensitive for her but very necessary and her being able to move forward.  She is making noticeable progress and needs to continue working with goal-directed behaviors in order to keep moving in a forward direction.  Encouraged patient and practicing more for positive and self affirming behaviors as noted in session including: Working with negative automatic thoughts and trying to challenge them to be more self accepting, decrease negative assumptions especially about herself,  interrupt her being judgmental with herself, positive self talk, healthy boundaries with others, decrease overthinking and over analyzing, look for more positives versus negatives daily, practice more proactive behaviors versus reactive, stay in the present focusing on what she can change or  control, remain in contact with supportive people, identify and emphasize her strengths, and realize the progress she shows working with goal-directed behaviors to move in a direction that supports her overall improved emotional health and wellbeing.  Self rating scales: 1-10 depression scale-5 1-10 anxiety scale-4 1-10 self-esteem scale-5 1-10 motivation scale-5 1-10 hopefulness scale-7  Goal review and progress/challenges noted with patient.  Next appt within 2 weeks.   Mathis Fare, LCSW

## 2023-03-17 ENCOUNTER — Encounter: Payer: Self-pay | Admitting: Physician Assistant

## 2023-03-17 ENCOUNTER — Ambulatory Visit: Payer: Medicare HMO | Admitting: Physician Assistant

## 2023-03-17 DIAGNOSIS — F411 Generalized anxiety disorder: Secondary | ICD-10-CM

## 2023-03-17 DIAGNOSIS — F3341 Major depressive disorder, recurrent, in partial remission: Secondary | ICD-10-CM

## 2023-03-17 MED ORDER — DULOXETINE HCL 60 MG PO CPEP: 60.0000 mg | ORAL_CAPSULE | Freq: Every day | ORAL | 1 refills | Status: DC

## 2023-03-17 NOTE — Progress Notes (Addendum)
[facility] Med Check  Patient ID: Denise Liu,  MRN: [id number]  PCP: Windy Carina, PA-C (Inactive)  Date of Evaluation: 03/17/2023 Time spent:20 minutes  Chief Complaint:  Chief Complaint   Depression; Follow-up    HISTORY/CURRENT STATUS: HPI For routine 6-week f/u.   At the last visit we weaned off Luvox and started Cymbalta.  She is feeling much better as far as her mood goes.  It has caused some hot flashes but that is the only side effect and it is tolerable.  She feels that the benefit outweighs that side effect.  She went to a family reunion in [place] since her last visit.  Energy and motivation are pretty good.  Appetite is normal and weight is stable.  She is sleeping well.  ADLs and personal hygiene are normal.  She occasionally has anxiety.  Not often though and the hydroxyzine is beneficial when needed.  No suicidal or homicidal thoughts.  Patient denies increased energy with decreased need for sleep, increased talkativeness, racing thoughts, impulsivity or risky behaviors, increased spending, increased libido, grandiosity, increased irritability or anger, paranoia, or hallucinations.  Denies dizziness, syncope, seizures, numbness, tingling, tremor, tics, unsteady gait, slurred speech, confusion. Denies muscle or joint pain, stiffness, or dystonia.  Individual Medical History/ Review of Systems: Changes? :No     Past medications for mental health diagnoses include: Topamax for migraines, modafinil, Zoloft, gabapentin she did not like the way it made her feel, Wellbutrin XL,   Allergies: Codeine, Wellbutrin [bupropion], and Topamax [topiramate]  Current Medications:  Current Outpatient Medications:    atorvastatin (LIPITOR) 10 MG tablet, Take 10 mg by mouth daily., Disp: , Rfl:    cyclobenzaprine (FLEXERIL) 10 MG tablet, Take 10 mg by mouth 3 (three) times daily as needed for muscle spasms., Disp: , Rfl:    DULoxetine (CYMBALTA) 60 MG capsule, Take 1 capsule  (60 mg total) by mouth daily., Disp: 90 capsule, Rfl: 1   fluticasone (FLONASE) 50 MCG/ACT nasal spray, 1 spray by Each Nare route daily., Disp: , Rfl:    hydrOXYzine (ATARAX) 10 MG tablet, TAKE 1 TO 2 TABLETS THREE TIMES A DAY AS NEEDED, Disp: 270 tablet, Rfl: 1   levothyroxine (SYNTHROID) 25 MCG tablet, Take by mouth., Disp: , Rfl:    lidocaine (LIDODERM) 5 %, Place 1 patch onto the skin daily. Remove & Discard patch within 12 hours or as directed by MD, Disp: , Rfl:    losartan (COZAAR) 25 MG tablet, Take 25 mg by mouth daily., Disp: , Rfl:    naproxen sodium (ANAPROX) 220 MG tablet, Take 220 mg by mouth as needed., Disp: , Rfl:    omeprazole (PRILOSEC) 20 MG capsule, TAKE 1 CAPSULE DAILY, Disp: , Rfl:    TRULICITY 0.75 MG/0.5ML SOPN, Inject into the skin., Disp: , Rfl:    metFORMIN (GLUCOPHAGE-XR) 500 MG 24 hr tablet, Take 500 mg by mouth daily. (Patient not taking: Reported on 02/02/2023), Disp: , Rfl:    potassium chloride SA (KLOR-CON) 20 MEQ tablet, Take by mouth. (Patient not taking: Reported on 08/01/2022), Disp: , Rfl:  Medication Side Effects: flushing  Family Medical/ Social History: Changes? No  MENTAL HEALTH EXAM:  There were no vitals taken for this visit.There is no height or weight on file to calculate BMI.  General Appearance: Casual, Neat, Well Groomed and Obese  Eye Contact:  Good  Speech:  Clear and Coherent and Normal Rate  Volume:  Normal  Mood:  Euthymic  Affect:  Congruent  Thought  Process:  Goal Directed and Descriptions of Associations: Intact  Orientation:  Full (Time, Place, and Person)  Thought Content: Logical   Suicidal Thoughts:  No  Homicidal Thoughts:  No  Memory:  WNL  Judgement:  Good  Insight:  Good  Psychomotor Activity:  Normal  Concentration:  Concentration: Good  Recall:  Good  Fund of Knowledge: Good  Language: Good  Assets:  Desire for Improvement Financial Resources/Insurance Housing Transportation  ADL's:  Intact  Cognition: WNL   Prognosis:  Good   DIAGNOSES:    ICD-10-CM   1. 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 Generalized anxiety disorder  F41.1       Receiving Psychotherapy: Yes   Rockne Menghini, LCSW  RECOMMENDATIONS:  PDMP was reviewed.  No controlled substances.   I provided 20 minutes of face to face time during this encounter, including time spent before and after the visit in records review, medical decision making, counseling pertinent to today's visit, and charting.   I am glad to see her doing better.  No change in treatment needed.  Continue Cymbalta 60 mg daily. Continue hydroxyzine 10 mg, 1-2 3 times daily as needed anxiety. Continue therapy with Rockne Menghini, LCSW. Return in 6 months.   Melony Overly, PA-C

## 2023-03-24 ENCOUNTER — Ambulatory Visit (INDEPENDENT_AMBULATORY_CARE_PROVIDER_SITE_OTHER): Payer: Medicare HMO | Admitting: Psychiatry

## 2023-03-24 DIAGNOSIS — F411 Generalized anxiety disorder: Secondary | ICD-10-CM

## 2023-03-24 NOTE — Progress Notes (Signed)
[facility] Counselor/Therapist Progress Note  Patient ID: Denise Liu, MRN: [id number],    Date: 03/24/2023  Time Spent: 50 minutes  Treatment Type: Individual Therapy  Reported Symptoms: anxiety, some restlessness, some depression (but improved)  Mental Status Exam:  Appearance:   Casual and Neat     Behavior:  Appropriate, Sharing, and Motivated  Motor:  Normal  Speech/Language:   Clear and Coherent  Affect:  Anxious, some depression  Mood:  anxious and depressed  Thought process:  goal directed  Thought content:    WNL  Sensory/Perceptual disturbances:    WNL  Orientation:  oriented to person, place, time/date, situation, day of week, month of year, year, and stated date of March 24, 2023  Attention:  Fair  Concentration:  Fair  Memory:  WNL  Fund of knowledge:   Good  Insight:    Good  Judgment:   Good  Impulse Control:  Good   Risk Assessment: Danger to Self:  No Self-injurious Behavior: No Danger to Others: No Duty to Warn:no Physical Aggression / Violence:No  Access to Firearms a concern: No  Gang Involvement:No   Subjective:  Patient in session today working on her anxiety, some depression, some motivational issues add to my restlessness. "My motivation is better but have moments where I have to work at motivating myself "but at least I can motivate myself." Reports the "family work" she did last session has really helped her and not as obsessed nor feeling as guilty. "Sometimes hard for husband to understand me emotionally." Processed this more and how it impacts her more now, and "I don't want to worry him". "We are natured very different, he gets along with everyone and is very outgoing." Noticing she is not as quick to be critical or get angry. Less self-judging. Some better "at managing my feelings." Is noticing more flexibility in her thinking. Looked at several examples re: her anxiety and shares how she is recognizing the anxiety more quickly and  starting to feel more empowered in managing it versus a victim of the anxiety. Continues to try and get enough sleep nightly, and states she does still stay up late but is getting some better sleep and not waking up as much during the night.  Worrying less.Still adjusting to husband's retirement and being home a lot more.  Overthinking decreasing. Don't judge others but do judge myself and am working to decrease this. Trying to be able to give myself compliments like I do other people.  Interventions: Cognitive Behavioral Therapy and Ego-Supportive  Long Term goal: Develop healthy cognitive patterns and beliefs about self and the world that lead to alleviation and help to prevent relapse of depression or anxiety. Short Term goal: Identify and replace depressive/anxious thinking that leads to depressive/anxious feelings and actions.Elevate her motivation and self-esteem.  Strategy: Educate the patient about cognitive restructuring including self-monitoring of automatic thoughts reflecting depressive/negative, and anxious beliefs  Diagnosis:   ICD-10-CM   1. Generalized anxiety disorder  F41.1      Plan:  Patient today reporting several areas of improvement. Staying busier with house projects, church-related activities, being with friends, and some summer travel. Has gradually begun working again with her geneaology that has been an interest for patient in the past. Seeing improvement helps her motivation. Is making progress and needs to continue her work with goal-directed behaviors and move in a  forward direction. Encouraged patient in her practice of more positive and self affirming behaviors as discussed in  session including: Further decrease her negative automatic thoughts to challenge them and be more self accepting, decrease negative assumptions especially about herself, interrupt her being judgmental with herself, positive self talk, healthy boundaries with others, decrease overthinking and  over analyzing, look for more positives versus negatives daily, practice more proactive behaviors versus reactive, stay in the present focusing on what she can control or change, remain in contact with supportive people, identify and emphasize her strengths, and recognize the progress she shows working with goal-directed behaviors to move in a direction that supports her overall improved emotional health and outlook. Wanting to improve more in her self-esteem.   Self rating scales: (March 24, 2023) 1-10 depression scale-3/4 1-10 anxiety scale-5 1-10 self-esteem scale-6/7 1-10 motivation scale-6 1-10 hopefulness scale-9  Goal review and progress/challenges noted with patient.  Next appt within 3 weeks.   Mathis Fare, LCSW

## 2023-04-07 ENCOUNTER — Ambulatory Visit (INDEPENDENT_AMBULATORY_CARE_PROVIDER_SITE_OTHER): Payer: Medicare HMO | Admitting: Psychiatry

## 2023-04-07 DIAGNOSIS — F411 Generalized anxiety disorder: Secondary | ICD-10-CM | POA: Diagnosis not present

## 2023-04-07 NOTE — Progress Notes (Signed)
[facility] Counselor/Therapist Progress Note  Patient ID: Denise Liu, MRN: [id number],    Date: 04/07/2023  Time Spent: 53 minutes   Treatment Type: Individual Therapy  Reported Symptoms: anxious, tired, restless, depression   Mental Status Exam:  Appearance:   Casual     Behavior:  Appropriate, Sharing, and Motivated  Motor:  Normal  Speech/Language:   Clear and Coherent  Affect:  Anxious, depressed  Mood:  anxious and depressed  Thought process:  goal directed  Thought content:    WNL  Sensory/Perceptual disturbances:    WNL  Orientation:  oriented to person, place, time/date, situation, day of week, month of year, year, and stated date of Aug. 6, 2024  Attention:  Good  Concentration:  Good  Memory:  WNL  Fund of knowledge:   Good  Insight:    Good  Judgment:   Good  Impulse Control:  Fair   Risk Assessment: Danger to Self:  No Self-injurious Behavior: No Danger to Others: No Duty to Warn:no Physical Aggression / Violence:No  Access to Firearms a concern: No  Gang Involvement:No   Subjective:  Patient today in session and reporting anxiety, depression, restless, and tired. Is to see her regular medical doctor this Friday and will be talking with her about the tiredness also. Motivation reported to be "good" but adds later that it fluctuates but has been more problematic recently. "Hard for me to follow through in performing tasks." Worked together in session today to help her create ways of "scheduling herself to do needed tasks as well as pleasurable activities" to "feel more motivated and effective" throughout the day. Used some quotes hanging in office today as patient reflected on how she related to them and found them helpful.  Plans to print out a couple of them and place in specific areas at her home that she feels would be helpful to her in increasing her motivation regarding strategies discussed today that can help her anxiety, depression, and   motivation for change.  Really good effort in session today even though she has been struggling to make certain changes.  Has remained less critical which she feels good about.  Less self judging overall.  Wants to be more flexible in her thinking and has begun to work on that more, as well as more effective in "managing feelings".  Feels that her overthinking is decreasing.  Has been practicing being able to recognize her positives at times "like I do the positives of other people".  While in session today, patient talked through a couple of strategies she plans to use for the next 2 days, as she feels short-term goals might work better for her right now and help her build confidence more quickly as she changes behaviors.  Interventions: Cognitive Behavioral Therapy and Ego-Supportive  Long Term goal: Develop healthy cognitive patterns and beliefs about self and the world that lead to alleviation and help to prevent relapse of depression or anxiety. Short Term goal: Identify and replace depressive/anxious thinking that leads to depressive/anxious feelings and actions.Elevate her motivation and self-esteem.  Strategy: Educate the patient about cognitive restructuring including self-monitoring of automatic thoughts reflecting depressive/negative, and anxious beliefs  Diagnosis:   ICD-10-CM   1. Generalized anxiety disorder  F41.1      Plan:  Patient in session today and continues to show improvement with some challenges at times and motivation and starting something different.  She has made progress with her goals and needs to continue working  with goal-directed behaviors in order to keep moving in a forward direction.  Encouraged patient and practicing more positive and self affirming behaviors as noted in session including: Continue to decrease her negative automatic thoughts to challenge them and be more self accepting, healthy boundaries with others, decrease negative assumptions herself,  interrupt her being judgmental with herself, use of positive self talk, decrease overthinking and over analyzing, practice more proactive behaviors versus reactive behaviors, remain in the present focusing on what she can control or change, stay in contact with supportive people, identify and emphasize her strengths, and realize the progress she shows working with goal-directed behaviors to move in a direction that supports improved emotional health and outlook.  Goal review and progress/challenges noted with patient.  Next appointment within 2 to 3 weeks.   Mathis Fare, LCSW

## 2023-04-30 ENCOUNTER — Ambulatory Visit: Payer: Medicare HMO | Admitting: Psychiatry

## 2023-04-30 ENCOUNTER — Ambulatory Visit (INDEPENDENT_AMBULATORY_CARE_PROVIDER_SITE_OTHER): Payer: Medicare HMO | Admitting: Psychiatry

## 2023-04-30 DIAGNOSIS — F411 Generalized anxiety disorder: Secondary | ICD-10-CM | POA: Diagnosis not present

## 2023-04-30 NOTE — Progress Notes (Signed)
      [facility] Counselor/Therapist Progress Note  Patient ID: Denise Liu, MRN: [id number],    Date: 04/30/2023  Time Spent: 50 minutes   Treatment Type: Individual Therapy  Reported Symptoms: Anxiety decreased, No depression. Is feeling noticeably better emotionally and physically.    Mental Status Exam:  Appearance:   Casual     Behavior:  Appropriate, Sharing, and Motivated  Motor:  Normal  Speech/Language:   Clear and Coherent  Affect:  Appropriate  Mood:  normal  Thought process:  normal  Thought content:    WNL  Sensory/Perceptual disturbances:    WNL  Orientation:  oriented to person, place, time/date, situation, day of week, month of year, year, and stated date of Aug. 29, 2024  Attention:  Good  Concentration:  Good  Memory:  WNL  Fund of knowledge:   Good  Insight:    Good  Judgment:   Good  Impulse Control:  Good   Risk Assessment: Danger to Self:  No Self-injurious Behavior: No Danger to Others: No Duty to Warn:no Physical Aggression / Violence:No  Access to Firearms a concern: No  Gang Involvement:No   Subjective: Patient in today with positive affect (smiling appropriately) and reporting that she had been feeling tired/weak physically and saw her PCP "who had lab work done and found that patient's blood sugar was high, Vitamin D was low, and is following up in 6 months to see oncologist due to elevation of white blood count." Motivation and outlook improved. Feels better physically. Overthinking has continued to decrease. No further problems with tiredness. Following through on tasks better. Outlook is better. Starting back up with her church activities in the Fall. Also starting some exercise at home with videos. Less self-criticalness and less self-judging. Recognizing her positives and positives in others. Is concerned that she may start putting things off again, and discussed strategies and motivation tips to help. Smiling more today.    Interventions: Cognitive Behavioral Therapy and Ego-Supportive  Long Term goal: Develop healthy cognitive patterns and beliefs about self and the world that lead to alleviation and help to prevent relapse of depression or anxiety. Short Term goal: Identify and replace depressive/anxious thinking that leads to depressive/anxious feelings and actions.Elevate her motivation and self-esteem.  Strategy: Educate the patient about cognitive restructuring including self-monitoring of automatic thoughts reflecting depressive/negative, and anxious beliefs  Diagnosis:   ICD-10-CM   1. Generalized anxiety disorder  F41.1      Plan: Patient is making progress and needs to continue on her meds and working with goal-directed behaviors to keep moving in a positive and more hopeful direction. 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  Goal review and progress/challenges noted with patient.  Next appt within 4 wks.   Mathis Fare, LCSW

## 2023-05-26 ENCOUNTER — Ambulatory Visit (INDEPENDENT_AMBULATORY_CARE_PROVIDER_SITE_OTHER): Payer: Medicare HMO | Admitting: Psychiatry

## 2023-05-26 DIAGNOSIS — F411 Generalized anxiety disorder: Secondary | ICD-10-CM

## 2023-05-26 NOTE — Progress Notes (Signed)
[facility] Counselor/Therapist Progress Note  Patient ID: Denise Liu, MRN: [id number],    Date: 05/26/2023  Time Spent:  48 minutes  Treatment Type: Individual Therapy  Reported Symptoms: Patient today reporting very little anxiety. No depression.    Mental Status Exam:  Appearance:   Casual     Behavior:  Appropriate, Sharing, and Motivated  Motor:  Normal  Speech/Language:   Clear and Coherent  Affect:  Appropriate and Full Range  Mood:  normal  Thought process:  goal directed  Thought content:    WNL  Sensory/Perceptual disturbances:    WNL  Orientation:  oriented to person, place, time/date, situation, day of week, month of year, year, and stated date of Sept. 24, 2024  Attention:  Good  Concentration:  Good  Memory:  WNL  Fund of knowledge:   Good  Insight:    Good  Judgment:   Good  Impulse Control:  Good   Risk Assessment: Danger to Self:  No Self-injurious Behavior: No Danger to Others: No Duty to Warn:no Physical Aggression / Violence:No  Access to Firearms a concern: No  Gang Involvement:No   Subjective:   Patient in session today reporting continuing to "do better and feel better, less anxious or depressed". Also reports having learned more patience and healthier boundary setting, enjoying retirement more with her husband. Does feel her diabetes med change has helped her along with her Cymbalta medication increase.  Regular Dr following her for other meds patient reports feeling stable. Much less tiredness. Overthinking remains very decreased. Overall outlook improved and husband has noticed her improvement as well. Involved in her church activities. Trying to add more exercise including walking. Less self-judging. Recognizing more of her positives and less perfectionism. Feeling much better and goals reviewed and met. Does not need to return at this point for therapy but will remain on her meds through med provider. Can call if needed in the future  and has some good support in place to support her as she continues to move in a forward direction.   Interventions: Ego-Supportive and Insight-Oriented, CBT  Long Term goal: Develop healthy cognitive patterns and beliefs about self and the world that lead to alleviation and help to prevent relapse of depression or anxiety. Short Term goal: Identify and replace depressive/anxious thinking that leads to depressive/anxious feelings and actions.Elevate her motivation and self-esteem.  Strategy: Educate the patient about cognitive restructuring including self-monitoring of automatic thoughts reflecting depressive/negative, and anxious beliefs  Diagnosis:   ICD-10-CM   1. Generalized anxiety disorder  F41.1      Plan: Patient has met current goals and is continuing to progress. Will remain on her meds as prescribed and followed by her med provider. Is doing well and does not need to return for therapy at this time. Knows she can call if needed.  Reminded and encouraged patient in practicing more positive and self-affirming behaviors as noted in sessions including:  Continue her work on self-acceptance, healthy boundaries with others, continues her work on decreasing negative assumptions of herself, positive self talk, practicing more proactive behaviors versus reactive, stay in the present focusing on what she can change or control, remain in contact with supportive people, identify and emphasize her strengths, and recognize the progress she is showing as she works with goal-directed behaviors to move in the direction that supports improved emotional health and her outlook into the future.    Goal review and progress/challenges noted with patient.  Has met her goals  and does not need to reschedule at this time.  Will call if needed into the future.   Mathis Fare, LCSW

## 2023-06-23 ENCOUNTER — Telehealth: Payer: Self-pay | Admitting: Gastroenterology

## 2023-06-23 NOTE — Telephone Encounter (Signed)
Hi Dr. Meridee Score,    Supervising Provider PM 06/23/23   We received a referral for patient to have a colonoscopy due to a positive fecal blood test. States she last had a colonoscopy 7 or 8 years ago but does not remember where and is unable to get previous records. Patient saw [facility] in 2023. States she never heard back from office to schedule a colonoscopy. Patient is now wishing to transfer her care. Previous records are in [facility] for you to review and advise on scheduling.    Thank you.

## 2023-06-24 NOTE — Telephone Encounter (Signed)
Outbound call to patient to scheduled colonoscopy. Left voicemail for patient to call back.

## 2023-06-24 NOTE — Telephone Encounter (Signed)
Patient has Iron insufficiency but without anemia. Begin workup with Colonoscopy reasonable with +FOBT. If negative colonoscopy or persisting issues of Iron in the future, then will need EGD. But I would begin with just Colonoscopy. Schedule next available Colonoscopy in [facility] [place]. Thanks. GM

## 2023-07-03 ENCOUNTER — Encounter: Payer: Self-pay | Admitting: Gastroenterology

## 2023-07-07 ENCOUNTER — Other Ambulatory Visit: Payer: Self-pay | Admitting: Physician Assistant

## 2023-07-07 NOTE — Telephone Encounter (Signed)
Perfect. Thanks.

## 2023-07-07 NOTE — Telephone Encounter (Signed)
Refused dt pt not taking per note 02/02/23 and no encounters referencing restarting the medication.

## 2023-08-03 ENCOUNTER — Other Ambulatory Visit: Payer: Self-pay

## 2023-08-03 ENCOUNTER — Encounter: Payer: Self-pay | Admitting: Gastroenterology

## 2023-08-03 ENCOUNTER — Ambulatory Visit (AMBULATORY_SURGERY_CENTER): Payer: Medicare HMO

## 2023-08-03 VITALS — Ht 64.0 in | Wt 220.0 lb

## 2023-08-03 DIAGNOSIS — Z1211 Encounter for screening for malignant neoplasm of colon: Secondary | ICD-10-CM

## 2023-08-03 MED ORDER — NA SULFATE-K SULFATE-MG SULF 17.5-3.13-1.6 GM/177ML PO SOLN
1.0000 | Freq: Once | ORAL | 0 refills | Status: AC
Start: 2023-08-03 — End: 2023-08-03

## 2023-08-03 NOTE — Progress Notes (Signed)
Denies allergies to eggs or soy products. Denies complication of anesthesia or sedation. Denies use of weight loss medication. Denies use of O2.   Emmi instructions given for colonoscopy.  

## 2023-08-21 ENCOUNTER — Encounter: Payer: Self-pay | Admitting: Internal Medicine

## 2023-08-21 ENCOUNTER — Ambulatory Visit: Payer: Medicare HMO | Admitting: Internal Medicine

## 2023-08-21 VITALS — BP 129/80 | HR 87 | Temp 97.9°F | Resp 15 | Ht 64.0 in | Wt 220.0 lb

## 2023-08-21 DIAGNOSIS — K648 Other hemorrhoids: Secondary | ICD-10-CM

## 2023-08-21 DIAGNOSIS — K635 Polyp of colon: Secondary | ICD-10-CM

## 2023-08-21 DIAGNOSIS — D123 Benign neoplasm of transverse colon: Secondary | ICD-10-CM | POA: Diagnosis not present

## 2023-08-21 DIAGNOSIS — Z1211 Encounter for screening for malignant neoplasm of colon: Secondary | ICD-10-CM

## 2023-08-21 DIAGNOSIS — D120 Benign neoplasm of cecum: Secondary | ICD-10-CM

## 2023-08-21 DIAGNOSIS — D122 Benign neoplasm of ascending colon: Secondary | ICD-10-CM

## 2023-08-21 MED ORDER — FERROUS SULFATE 325 (65 FE) MG PO TBEC: 325.0000 mg | DELAYED_RELEASE_TABLET | Freq: Every day | ORAL | 3 refills | Status: AC

## 2023-08-21 MED ORDER — SODIUM CHLORIDE 0.9 % IV SOLN: 500.0000 mL | Freq: Once | INTRAVENOUS | Status: DC

## 2023-08-21 NOTE — Progress Notes (Signed)
Report given to PACU, vss 
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
## 2023-08-21 NOTE — Op Note (Addendum)
[facility] Patient Name: Denise Liu Procedure Date: 08/21/2023 3:41 PM MRN: [id number] Endoscopist: Madelyn Brunner [place] , , [id number] Age: 67 Referring MD:  Date of Birth: 10-Jul-1956 Gender: Female Account #: [id number] Procedure:                Colonoscopy Indications:              Iron deficiency anemia, Positive fecal                            immunochemical test Medicines:                Monitored Anesthesia Care Procedure:                Pre-Anesthesia Assessment:                           - Prior to the procedure, a History and Physical                            was performed, and patient medications and                            allergies were reviewed. The patient's tolerance of                            previous anesthesia was also reviewed. The risks                            and benefits of the procedure and the sedation                            options and risks were discussed with the patient.                            All questions were answered, and informed consent                            was obtained. Prior Anticoagulants: The patient has                            taken no anticoagulant or antiplatelet agents. ASA                            Grade Assessment: II - A patient with mild systemic                            disease. After reviewing the risks and benefits,                            the patient was deemed in satisfactory condition to                            undergo the procedure.  After obtaining informed consent, the colonoscope                            was passed under direct vision. Throughout the                            procedure, the patient's blood pressure, pulse, and                            oxygen saturations were monitored continuously. The                            Olympus Scope SN [id number] was introduced through the                            anus and advanced to the the terminal  ileum. The                            colonoscopy was performed without difficulty. The                            patient tolerated the procedure well. The quality                            of the bowel preparation was excellent. The                            terminal ileum, ileocecal valve, appendiceal                            orifice, and rectum were photographed. Scope In: 3:49:02 PM Scope Out: 4:05:03 PM Scope Withdrawal Time: 0 hours 12 minutes 5 seconds  Total Procedure Duration: 0 hours 16 minutes 1 second  Findings:                 The terminal ileum appeared normal.                           Three sessile polyps were found in the transverse                            colon, ascending colon and cecum. The polyps were 3                            to 7 mm in size. These polyps were removed with a                            cold snare. Resection and retrieval were complete.                           Non-bleeding internal hemorrhoids were found during                            retroflexion. Complications:  No immediate complications. Estimated Blood Loss:     Estimated blood loss was minimal. Impression:               - The examined portion of the ileum was normal.                           - Three 3 to 7 mm polyps in the transverse colon,                            in the ascending colon and in the cecum, removed                            with a cold snare. Resected and retrieved.                           - Non-bleeding internal hemorrhoids. Recommendation:           - Discharge patient to home (with escort).                           - Await pathology results.                           - Start ferrous sulfate 325 mg daily. Patient was                            not previously on an iron supplement. If patient's                            iron deficiency persists in the future, could                            consider EGD for further evaluation.                            - Follow up in 2-3 months with PCP for iron                            deficiency.                           - The findings and recommendations were discussed                            with the patient. Dr Particia Lather "Alan Ripper" Leonides Schanz,  08/21/2023 4:12:01 PM

## 2023-08-21 NOTE — Progress Notes (Signed)
Called to room to assist during endoscopic procedure.  Patient ID and intended procedure confirmed with present staff. Received instructions for my participation in the procedure from the performing physician.  

## 2023-08-21 NOTE — Patient Instructions (Addendum)
-   Await pathology results. - Follow up in 2-3 months for follow up of IDA.   YOU HAD AN ENDOSCOPIC PROCEDURE TODAY AT THE [facility]:   Refer to the procedure report that was given to you for any specific questions about what was found during the examination.  If the procedure report does not answer your questions, please call your gastroenterologist to clarify.  If you requested that your care partner not be given the details of your procedure findings, then the procedure report has been included in a sealed envelope for you to review at your convenience later.  YOU SHOULD EXPECT: Some feelings of bloating in the abdomen. Passage of more gas than usual.  Walking can help get rid of the air that was put into your GI tract during the procedure and reduce the bloating. If you had a lower endoscopy (such as a colonoscopy or flexible sigmoidoscopy) you may notice spotting of blood in your stool or on the toilet paper. If you underwent a bowel prep for your procedure, you may not have a normal bowel movement for a few days.  Please Note:  You might notice some irritation and congestion in your nose or some drainage.  This is from the oxygen used during your procedure.  There is no need for concern and it should clear up in a day or so.  SYMPTOMS TO REPORT IMMEDIATELY:  Following lower endoscopy (colonoscopy or flexible sigmoidoscopy):  Excessive amounts of blood in the stool  Significant tenderness or worsening of abdominal pains  Swelling of the abdomen that is new, acute  Fever of 100F or higher  For urgent or emergent issues, a gastroenterologist can be reached at any hour by calling [phone]. Do not use MyChart messaging for urgent concerns.    DIET:  We do recommend a small meal at first, but then you may proceed to your regular diet.  Drink plenty of fluids but you should avoid alcoholic beverages for 24 hours.  ACTIVITY:  You should plan to take it easy for the rest of  today and you should NOT DRIVE or use heavy machinery until tomorrow (because of the sedation medicines used during the test).    FOLLOW UP: Our staff will call the number listed on your records the next business day following your procedure.  We will call around 7:15- 8:00 am to check on you and address any questions or concerns that you may have regarding the information given to you following your procedure. If we do not reach you, we will leave a message.     If any biopsies were taken you will be contacted by phone or by letter within the next 1-3 weeks.  Please call us at [phone] if you have not heard about the biopsies in 3 weeks.    SIGNATURES/CONFIDENTIALITY: You and/or your care partner have signed paperwork which will be entered into your electronic medical record.  These signatures attest to the fact that that the information above on your After Visit Summary has been reviewed and is understood.  Full responsibility of the confidentiality of this discharge information lies with you and/or your care-partner.

## 2023-08-21 NOTE — Progress Notes (Signed)
Pt's states no medical or surgical changes since previsit or office visit. 

## 2023-08-24 ENCOUNTER — Telehealth: Payer: Self-pay

## 2023-08-24 NOTE — Telephone Encounter (Signed)
  Follow up Call-     08/21/2023    3:21 PM  Call back number  Post procedure Call Back phone  # [phone]  Permission to leave phone message Yes     Patient questions:  Do you have a fever, pain , or abdominal swelling? No. Pain Score  0 *  Have you tolerated food without any problems? Yes.    Have you been able to return to your normal activities? Yes.    Do you have any questions about your discharge instructions: Diet   No. Medications  No. Follow up visit  No.  Do you have questions or concerns about your Care? No.  Actions: * If pain score is 4 or above: No action needed, pain <4.

## 2023-08-28 ENCOUNTER — Encounter: Payer: Self-pay | Admitting: Internal Medicine

## 2023-08-28 LAB — SURGICAL PATHOLOGY

## 2023-09-17 ENCOUNTER — Ambulatory Visit (INDEPENDENT_AMBULATORY_CARE_PROVIDER_SITE_OTHER): Payer: Medicare HMO | Admitting: Physician Assistant

## 2023-09-17 ENCOUNTER — Encounter: Payer: Self-pay | Admitting: Physician Assistant

## 2023-09-17 DIAGNOSIS — R7989 Other specified abnormal findings of blood chemistry: Secondary | ICD-10-CM | POA: Diagnosis not present

## 2023-09-17 DIAGNOSIS — F411 Generalized anxiety disorder: Secondary | ICD-10-CM

## 2023-09-17 DIAGNOSIS — Z79899 Other long term (current) drug therapy: Secondary | ICD-10-CM

## 2023-09-17 DIAGNOSIS — F5105 Insomnia due to other mental disorder: Secondary | ICD-10-CM

## 2023-09-17 DIAGNOSIS — F3341 Major depressive disorder, recurrent, in partial remission: Secondary | ICD-10-CM

## 2023-09-17 DIAGNOSIS — F99 Mental disorder, not otherwise specified: Secondary | ICD-10-CM

## 2023-09-17 MED ORDER — DULOXETINE HCL 60 MG PO CPEP: 60.0000 mg | ORAL_CAPSULE | Freq: Every day | ORAL | 1 refills | Status: DC

## 2023-09-17 MED ORDER — VITAMIN D (ERGOCALCIFEROL) 1.25 MG (50000 UNIT) PO CAPS: 50000.0000 [IU] | ORAL_CAPSULE | ORAL | 0 refills | Status: DC

## 2023-09-17 NOTE — Progress Notes (Signed)
[facility]  Patient ID: Denise Liu,  MRN: [id number]  PCP: Denise Carina, PA-C (Inactive)  Date of Evaluation: 09/17/2023 Time spent: 32 minutes  Chief Complaint:  Chief Complaint   Anxiety; Depression; Follow-up    HISTORY/CURRENT STATUS: HPI For routine med check  Denise Liu states she is doing well for the most part.  She is enjoying retirement.  Likes to read and watches movies.  Spends time with her grandkids as much as possible.  She had labs recently with her PCP.  She will be seeing hematology soon because of abnormal CBC.  Also will see cardiology because of shortness of breath.  Patient is able to enjoy things.  Energy and motivation are good.  No extreme sadness, tearfulness, or feelings of hopelessness.  Sleeps well most of the time.  ADLs and personal hygiene are normal.   Denies any changes in concentration, making decisions, or remembering things.  Appetite has not changed.  Weight is stable.  Occasionally has anxiety but not very often.  She takes the hydroxyzine as needed and it is still effective.  Denies suicidal or homicidal thoughts.  Patient denies increased energy with decreased need for sleep, increased talkativeness, racing thoughts, impulsivity or risky behaviors, increased spending, increased libido, grandiosity, increased irritability or anger, paranoia, or hallucinations.  Denies dizziness, syncope, seizures, numbness, tingling, tremor, tics, unsteady gait, slurred speech, confusion. Denies muscle or joint pain, stiffness, or dystonia. Denies unexplained weight loss, frequent infections, or sores that heal slowly.  No polyphagia, polydipsia, or polyuria. Denies visual changes or paresthesias.   Individual Medical History/ Review of Systems: Changes? :Yes    abnl labs 09/08/2023.  See HPI.  Past medications for mental health diagnoses include: Topamax for migraines, modafinil, Zoloft, gabapentin she did not like the way it made her feel, Wellbutrin  XL,   Allergies: Codeine, Wellbutrin [bupropion], and Topamax [topiramate]  Current Medications:  Current Outpatient Medications:    atorvastatin (LIPITOR) 10 MG tablet, Take 10 mg by mouth daily., Disp: , Rfl:    cyclobenzaprine (FLEXERIL) 10 MG tablet, Take 10 mg by mouth 3 (three) times daily as needed for muscle spasms., Disp: , Rfl:    ferrous sulfate 325 (65 FE) MG EC tablet, Take 1 tablet (325 mg total) by mouth daily with breakfast., Disp: 90 tablet, Rfl: 3   fluticasone (FLONASE) 50 MCG/ACT nasal spray, 1 spray by Each Nare route daily., Disp: , Rfl:    hydrOXYzine (ATARAX) 10 MG tablet, TAKE 1 TO 2 TABLETS THREE TIMES A DAY AS NEEDED, Disp: 270 tablet, Rfl: 1   levothyroxine (SYNTHROID) 25 MCG tablet, Take by mouth., Disp: , Rfl:    lidocaine (LIDODERM) 5 %, Place 1 patch onto the skin daily. Remove & Discard patch within 12 hours or as directed by MD, Disp: , Rfl:    losartan (COZAAR) 25 MG tablet, Take 25 mg by mouth daily., Disp: , Rfl:    naproxen sodium (ANAPROX) 220 MG tablet, Take 220 mg by mouth as needed., Disp: , Rfl:    omeprazole (PRILOSEC) 20 MG capsule, TAKE 1 CAPSULE DAILY, Disp: , Rfl:    TRULICITY 3 MG/0.5ML SOAJ, Inject into the skin., Disp: , Rfl:    VITAMIN D PO, Take by mouth., Disp: , Rfl:    Vitamin D, Ergocalciferol, (DRISDOL) 1.25 MG (50000 UNIT) CAPS capsule, Take 1 capsule (50,000 Units total) by mouth every 7 (seven) days., Disp: 12 capsule, Rfl: 0   DULoxetine (CYMBALTA) 60 MG capsule, Take 1 capsule (60  mg total) by mouth daily., Disp: 90 capsule, Rfl: 1   TRULICITY 0.75 MG/0.5ML SOPN, Inject into the skin. (Patient not taking: Reported on 09/17/2023), Disp: , Rfl:  Medication Side Effects: flushing  Family Medical/ Social History: Changes? No  MENTAL HEALTH EXAM:  There were no vitals taken for this visit.There is no height or weight on file to calculate BMI.  General Appearance: Casual, Neat, Well Groomed and Obese  Eye Contact:  Good  Speech:   Clear and Coherent and Normal Rate  Volume:  Normal  Mood:  Euthymic  Affect:  Congruent  Thought Process:  Goal Directed and Descriptions of Associations: Intact  Orientation:  Full (Time, Place, and Person)  Thought Content: Logical   Suicidal Thoughts:  No  Homicidal Thoughts:  No  Memory:  WNL  Judgement:  Good  Insight:  Good  Psychomotor Activity:  Normal  Concentration:  Concentration: Good  Recall:  Good  Fund of Knowledge: Good  Language: Good  Assets:  Communication Skills Desire for Improvement Financial Resources/Insurance Housing Transportation  ADL's:  Intact  Cognition: WNL  Prognosis:  Good   Labs 09/08/2023 through her primary provider were reviewed   DIAGNOSES:    ICD-10-CM   1. Generalized anxiety disorder  F41.1     2. Recurrent major depression in partial remission (HCC)  F33.41     3. Insomnia due to other mental disorder  F51.05    F99     4. Low serum vitamin D  R79.89 VITAMIN D 25 Hydroxy (Vit-D Deficiency, Fractures)    5. Encounter for long-term (current) use of medications  Z79.899 VITAMIN D 25 Hydroxy (Vit-D Deficiency, Fractures)      Receiving Psychotherapy: Yes   Denise Menghini, LCSW  RECOMMENDATIONS:  PDMP was reviewed.  No controlled substances.   I provided 32 minutes of face to face time during this encounter, including time spent before and after the visit in records review, medical decision making, counseling pertinent to today's visit, and charting.   We discussed her labs.  I recommend adding prescription strength vitamin D because of her low level.  For mental health reasons it is preferred to be at least 50-60.  That can help mood.  She would like to start it.  No other changes will be made. Continue Cymbalta 60 mg daily. Continue hydroxyzine 10 mg, 1-2 3 times daily as needed anxiety. Start vitamin D 50,000 IUs once per week. Lab to be drawn in approximately 6 to 8 weeks. Continue therapy with Denise Menghini, LCSW. Return in  6 months.   Denise Overly, PA-C

## 2023-11-15 ENCOUNTER — Encounter: Payer: Self-pay | Admitting: Cardiovascular Disease

## 2023-11-15 NOTE — Progress Notes (Unsigned)
  Cardiology Office Note:  .   Date:  11/16/2023  ID:  Denise Liu, DOB 12/03/1955, MRN [id number] PCP: Sherrie George (Inactive)  [facility] Providers Cardiologist:       History of Present Illness: .   Denise Liu is a 68 y.o. female with HLD, HTN, TIA and obesity   She is referred for further management of her HLD  and shortness of breath   DOE over 6 months  Climbing stairs  When she is waking uphill No CP , no tightness Is very tired since COVID started  Gets COVID boosters every year  Is not as active as she needs to be Does chair yoga on occasion   No leg swelling  Non smoker    Fam hx Mother - rheumatic fever , rheumatic heart disease  Father - died at 6 of MI           ROS:   Studies Reviewed: Marland Kitchen   EKG Interpretation Date/Time:  Monday November 16 2023 09:01:48 EDT Ventricular Rate:  83 PR Interval:  168 QRS Duration:  78 QT Interval:  398 QTC Calculation: 467 R Axis:   29  Text Interpretation: Normal sinus rhythm Normal ECG No previous ECGs available Confirmed by Kristeen Miss ([id number]) on 11/16/2023 9:02:45 AM     Risk Assessment/Calculations:             Physical Exam:   VS:  BP 126/72   Pulse 80   Ht 5\' 4"  (1.626 m)   Wt 205 lb (93 kg)   SpO2 98%   BMI 35.19 kg/m    Wt Readings from Last 3 Encounters:  11/16/23 205 lb (93 kg)  08/21/23 220 lb (99.8 kg)  08/03/23 220 lb (99.8 kg)    GEN: Well nourished, well developed in no acute distress NECK: No JVD; No carotid bruits CARDIAC: RRR, no murmurs, rubs, gallops RESPIRATORY:  Clear to auscultation without rales, wheezing or rhonchi  ABDOMEN: Soft, non-tender, non-distended EXTREMITIES:  No edema; No deformity   ASSESSMENT AND PLAN: .    Dyspnea:    she does not exercise at all.  Her DOE is  likely from deconditioning.  Will get an echo also  She will try to start a work out program  If she is not able to advance in her exercise program, she may need  additional workup   2.   HTN:   BP is well controlled.   . Advised continued diet, weight loss efforts.        Dispo: 6 months with APP   Signed, Kristeen Miss, MD

## 2023-11-16 ENCOUNTER — Ambulatory Visit: Payer: Medicare HMO | Attending: Cardiovascular Disease | Admitting: Cardiovascular Disease

## 2023-11-16 ENCOUNTER — Encounter: Payer: Self-pay | Admitting: Cardiovascular Disease

## 2023-11-16 VITALS — BP 126/72 | HR 80 | Ht 64.0 in | Wt 205.0 lb

## 2023-11-16 DIAGNOSIS — R0602 Shortness of breath: Secondary | ICD-10-CM

## 2023-11-16 NOTE — Patient Instructions (Signed)
 Testing/Procedures: ECHO Your physician has requested that you have an echocardiogram. Echocardiography is a painless test that uses sound waves to create images of your heart. It provides your doctor with information about the size and shape of your heart and how well your heart's chambers and valves are working. This procedure takes approximately one hour. There are no restrictions for this procedure. Please do NOT wear cologne, perfume, aftershave, or lotions (deodorant is allowed). Please arrive 15 minutes prior to your appointment time.  Please note: We ask at that you not bring children with you during ultrasound (echo/ vascular) testing. Due to room size and safety concerns, children are not allowed in the ultrasound rooms during exams. Our front office staff cannot provide observation of children in our lobby area while testing is being conducted. An adult accompanying a patient to their appointment will only be allowed in the ultrasound room at the discretion of the ultrasound technician under special circumstances. We apologize for any inconvenience.  Follow-Up: At [facility], you and your health needs are our priority.  As part of our continuing mission to provide you with exceptional heart care, we have created designated Provider Care Teams.  These Care Teams include your primary Cardiologist (physician) and Advanced Practice Providers (APPs -  Physician Assistants and Nurse Practitioners) who all work together to provide you with the care you need, when you need it.  Your next appointment:   6 month(s)  Provider:   Kristeen Miss or APP   1st Floor: - Lobby - Registration  - Pharmacy  - Lab - Cafe  2nd Floor: - PV Lab - Diagnostic Testing (echo, CT, nuclear med)  3rd Floor: - Vacant  4th Floor: - TCTS (cardiothoracic surgery) - AFib Clinic - Structural Heart Clinic - Vascular Surgery  - Vascular Ultrasound  5th Floor: - [facility]Care Cardiology (general and  EP) - Clinical Pharmacy for coumadin, hypertension, lipid, weight-loss medications, and med management appointments    Valet parking services will be available as well.

## 2023-11-24 ENCOUNTER — Ambulatory Visit (HOSPITAL_COMMUNITY): Attending: Cardiology

## 2023-11-24 DIAGNOSIS — R0602 Shortness of breath: Secondary | ICD-10-CM | POA: Insufficient documentation

## 2023-11-24 LAB — ECHOCARDIOGRAM COMPLETE
Area-P 1/2: 3.39 cm2
S' Lateral: 2.6 cm

## 2023-11-26 ENCOUNTER — Encounter: Payer: Self-pay | Admitting: Cardiovascular Disease

## 2024-03-16 ENCOUNTER — Encounter: Payer: Self-pay | Admitting: Physician Assistant

## 2024-03-16 ENCOUNTER — Ambulatory Visit (INDEPENDENT_AMBULATORY_CARE_PROVIDER_SITE_OTHER): Payer: TRICARE For Life (TFL) | Admitting: Physician Assistant

## 2024-03-16 DIAGNOSIS — F5105 Insomnia due to other mental disorder: Secondary | ICD-10-CM

## 2024-03-16 DIAGNOSIS — F411 Generalized anxiety disorder: Secondary | ICD-10-CM | POA: Diagnosis not present

## 2024-03-16 DIAGNOSIS — F99 Mental disorder, not otherwise specified: Secondary | ICD-10-CM | POA: Diagnosis not present

## 2024-03-16 DIAGNOSIS — F3341 Major depressive disorder, recurrent, in partial remission: Secondary | ICD-10-CM
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
## 2024-03-18 ENCOUNTER — Encounter: Payer: Self-pay | Admitting: Advanced Practice Midwife

## 2024-04-04 ENCOUNTER — Other Ambulatory Visit: Payer: Self-pay | Admitting: Physician Assistant

## 2024-05-13 ENCOUNTER — Encounter: Payer: Self-pay | Admitting: Physician Assistant

## 2024-08-18 ENCOUNTER — Telehealth: Payer: Self-pay | Admitting: Physician Assistant

## 2024-08-18 NOTE — Telephone Encounter (Signed)
Appt 11/6

## 2024-08-18 NOTE — Telephone Encounter (Signed)
 Pt called at 11:16a requesting refill of Cymbalta  to [facility].  She said [facility] doesn't have any in stock.

## 2024-08-18 NOTE — Telephone Encounter (Signed)
 Sent Rx to [facility] per request.

## 2024-09-16 ENCOUNTER — Ambulatory Visit: Admitting: Physician Assistant

## 2024-09-16 ENCOUNTER — Encounter: Payer: Self-pay | Admitting: Physician Assistant

## 2024-09-16 DIAGNOSIS — F3341 Major depressive disorder, recurrent, in partial remission: Secondary | ICD-10-CM

## 2024-09-16 DIAGNOSIS — F5105 Insomnia due to other mental disorder: Secondary | ICD-10-CM | POA: Diagnosis not present

## 2024-09-16 DIAGNOSIS — F411 Generalized anxiety disorder: Secondary | ICD-10-CM

## 2024-09-16 DIAGNOSIS — F99 Mental disorder, not otherwise specified: Secondary | ICD-10-CM | POA: Diagnosis not present

## 2024-09-16 NOTE — Progress Notes (Signed)
 "     [facility]  Patient ID: Denise Liu,  MRN: [id number]  PCP: Elnor Rocky PARAS, PA-C (Inactive)  Date of Evaluation: 09/16/2024 Time spent:20 minutes  Chief Complaint:  Chief Complaint   Anxiety; Depression; Follow-up    HISTORY/CURRENT STATUS: HPI For routine med check  Doing well as far mood goes. Her PCP increased the Cymbalta  to help with pain.  She did have improvement. However she wasn't sure how she was going to have 2 different providers give the same med.  She had trouble recently with [facility] just because they were out of it.    Patient is able to enjoy things.  She likes to read, travel, she watches some TV.  She is active in church with choir and the [employer].  She is enjoying retirement.  Energy and motivation are good.  No extreme sadness, tearfulness, or feelings of hopelessness.  Sleeps well most of the time. ADLs and personal hygiene are normal.   Denies any changes in concentration, making decisions, or remembering things.  Appetite has not changed.  No complaints of anxiety.  No mania, delirium, AH/VH.  No SI/HI.  Individual Medical History/ Review of Systems: Changes? :Yes       Past medications for mental health diagnoses include: Topamax for migraines, modafinil, Zoloft, gabapentin she did not like the way it made her feel, Wellbutrin  XL,   Allergies: Codeine, Wellbutrin  [bupropion ], and Topamax [topiramate]  Current Medications:  Current Outpatient Medications:    atorvastatin (LIPITOR) 10 MG tablet, Take 10 mg by mouth daily., Disp: , Rfl:    cyclobenzaprine (FLEXERIL) 10 MG tablet, Take 10 mg by mouth 3 (three) times daily as needed for muscle spasms., Disp: , Rfl:    Dulaglutide 4.5 MG/0.5ML SOAJ, Inject into the skin., Disp: , Rfl:    DULoxetine  (CYMBALTA ) 60 MG capsule, Take 1 capsule (60 mg total) by mouth daily., Disp: 90 capsule, Rfl: 0   empagliflozin (JARDIANCE) 25 MG TABS tablet, Take 1 tablet by mouth daily., Disp: , Rfl:     fluticasone (FLONASE) 50 MCG/ACT nasal spray, 1 spray by Each Nare route daily., Disp: , Rfl:    hydrOXYzine  (ATARAX ) 10 MG tablet, TAKE 1 TO 2 TABLETS THREE TIMES A DAY AS NEEDED, Disp: 270 tablet, Rfl: 1   levothyroxine (SYNTHROID) 25 MCG tablet, Take by mouth., Disp: , Rfl:    losartan (COZAAR) 50 MG tablet, Take 50 mg by mouth daily., Disp: , Rfl:    naproxen sodium (ANAPROX) 220 MG tablet, Take 220 mg by mouth as needed., Disp: , Rfl:    omeprazole (PRILOSEC) 20 MG capsule, TAKE 1 CAPSULE DAILY, Disp: , Rfl:    VITAMIN D  PO, Take by mouth., Disp: , Rfl:    ferrous sulfate  325 (65 FE) MG EC tablet, Take 1 tablet (325 mg total) by mouth daily with breakfast. (Patient not taking: Reported on 03/16/2024), Disp: 90 tablet, Rfl: 3 Medication Side Effects: flushing  Family Medical/ Social History: Changes? No  MENTAL HEALTH EXAM:  There were no vitals taken for this visit.There is no height or weight on file to calculate BMI.  General Appearance: Casual and Well Groomed  Eye Contact:  Good  Speech:  Clear and Coherent and Normal Rate  Volume:  Normal  Mood:  Euthymic  Affect:  Congruent  Thought Process:  Goal Directed and Descriptions of Associations: Intact  Orientation:  Full (Time, Place, and Person)  Thought Content: Logical   Suicidal Thoughts:  No  Homicidal  Thoughts:  No  Memory:  WNL  Judgement:  Good  Insight:  Good  Psychomotor Activity:  Normal  Concentration:  Concentration: Good and Attention Span: Good  Recall:  Good  Fund of Knowledge: Good  Language: Good  Assets:  Communication Skills Desire for Improvement Financial Resources/Insurance Housing Transportation  ADL's:  Intact  Cognition: WNL  Prognosis:  Good   Labs reviewed on chart from  09/12/2024  DIAGNOSES:    ICD-10-CM   1. Recurrent major depression in partial remission  F33.41     2. Generalized anxiety disorder  F41.1     3. Insomnia due to other mental disorder  F51.05    F99        Receiving Psychotherapy: Yes   Marval Bunde, LCSW  RECOMMENDATIONS:  PDMP was reviewed.  Gabapentin filled 09/12/2024. I provided approximately  20 minutes of face to face time during this encounter, including time spent before and after the visit in records review, medical decision making, counseling pertinent to today's visit, and charting.   We discussed the Cymbalta .  I do not mind prescribing a total of 120 mg.  For now though she prefers to keep it at 60 mg since her provider is going to give her something else for nerve pain.  Zohra can let me know anytime if she would like for me to increase the Cymbalta .  Her vitamin D  is low normal.  I recommend she increase her dose over-the-counter.  Will increase to 3000 units.  She will see her PCP in the summer and have it rechecked then.  Continue Cymbalta  60 mg daily. Continue hydroxyzine  10 mg, 1-2 3 times daily as needed anxiety. Continue therapy with Marval Bunde, LCSW. Return in 6 months.   Verneita Cooks, PA-C  "

## 2025-03-16 ENCOUNTER — Ambulatory Visit: Admitting: Physician Assistant
